# Patient Record
Sex: Female | Born: 1945
Health system: Southern US, Community
[De-identification: ages and names within clinical notes are randomized; demographics above are authoritative.]

## PROBLEM LIST (undated history)

## (undated) DIAGNOSIS — M1990 Unspecified osteoarthritis, unspecified site: Secondary | ICD-10-CM

## (undated) DIAGNOSIS — C50912 Malignant neoplasm of unspecified site of left female breast: Secondary | ICD-10-CM

## (undated) DIAGNOSIS — N183 Chronic kidney disease, stage 3 unspecified: Secondary | ICD-10-CM

## (undated) DIAGNOSIS — S68119A Complete traumatic metacarpophalangeal amputation of unspecified finger, initial encounter: Secondary | ICD-10-CM

## (undated) DIAGNOSIS — I719 Aortic aneurysm of unspecified site, without rupture: Secondary | ICD-10-CM

## (undated) DIAGNOSIS — G5793 Unspecified mononeuropathy of bilateral lower limbs: Secondary | ICD-10-CM

---

## 1998-12-27 ENCOUNTER — Emergency Department (HOSPITAL_COMMUNITY): Admission: EM | Admit: 1998-12-27 | Discharge: 1998-12-27 | Payer: Self-pay | Admitting: Emergency Medicine

## 1999-08-26 ENCOUNTER — Encounter: Payer: Self-pay | Admitting: Neurological Surgery

## 1999-08-26 ENCOUNTER — Ambulatory Visit (HOSPITAL_COMMUNITY): Admission: RE | Admit: 1999-08-26 | Discharge: 1999-08-26 | Payer: Self-pay | Admitting: Neurological Surgery

## 2001-04-17 ENCOUNTER — Ambulatory Visit (HOSPITAL_COMMUNITY): Admission: RE | Admit: 2001-04-17 | Discharge: 2001-04-17 | Payer: Self-pay | Admitting: Neurological Surgery

## 2001-04-17 ENCOUNTER — Encounter: Payer: Self-pay | Admitting: Neurological Surgery

## 2001-04-18 ENCOUNTER — Encounter: Payer: Self-pay | Admitting: Neurological Surgery

## 2001-04-18 ENCOUNTER — Encounter: Admission: RE | Admit: 2001-04-18 | Discharge: 2001-04-18 | Payer: Self-pay | Admitting: Neurological Surgery

## 2003-01-04 ENCOUNTER — Encounter: Payer: Self-pay | Admitting: Emergency Medicine

## 2003-01-04 ENCOUNTER — Emergency Department (HOSPITAL_COMMUNITY): Admission: EM | Admit: 2003-01-04 | Discharge: 2003-01-04 | Payer: Self-pay | Admitting: Emergency Medicine

## 2004-11-02 ENCOUNTER — Ambulatory Visit: Payer: Self-pay | Admitting: Internal Medicine

## 2007-12-12 ENCOUNTER — Emergency Department: Payer: Self-pay | Admitting: Emergency Medicine

## 2009-01-07 ENCOUNTER — Ambulatory Visit: Payer: Self-pay | Admitting: Gastroenterology

## 2009-01-12 ENCOUNTER — Ambulatory Visit: Payer: Self-pay | Admitting: Gastroenterology

## 2012-08-06 DIAGNOSIS — C50912 Malignant neoplasm of unspecified site of left female breast: Secondary | ICD-10-CM

## 2012-08-06 HISTORY — DX: Malignant neoplasm of unspecified site of left female breast: C50.912

## 2014-05-10 ENCOUNTER — Ambulatory Visit: Payer: Self-pay | Admitting: Gastroenterology

## 2014-05-12 LAB — PATHOLOGY REPORT

## 2016-09-10 DIAGNOSIS — K119 Disease of salivary gland, unspecified: Secondary | ICD-10-CM | POA: Diagnosis not present

## 2016-09-10 DIAGNOSIS — R05 Cough: Secondary | ICD-10-CM | POA: Diagnosis not present

## 2016-09-12 ENCOUNTER — Other Ambulatory Visit: Payer: Self-pay | Admitting: Otolaryngology

## 2016-09-12 DIAGNOSIS — D110 Benign neoplasm of parotid gland: Secondary | ICD-10-CM

## 2016-09-12 DIAGNOSIS — R221 Localized swelling, mass and lump, neck: Secondary | ICD-10-CM

## 2016-09-12 DIAGNOSIS — D37030 Neoplasm of uncertain behavior of the parotid salivary glands: Secondary | ICD-10-CM | POA: Diagnosis not present

## 2016-09-12 DIAGNOSIS — H6980 Other specified disorders of Eustachian tube, unspecified ear: Secondary | ICD-10-CM | POA: Diagnosis not present

## 2016-09-18 ENCOUNTER — Ambulatory Visit
Admission: RE | Admit: 2016-09-18 | Discharge: 2016-09-18 | Disposition: A | Discharge status: Home / Self Care | Payer: 59 | Source: Ambulatory Visit | Attending: Otolaryngology | Admitting: Otolaryngology

## 2016-09-18 DIAGNOSIS — K118 Other diseases of salivary glands: Secondary | ICD-10-CM | POA: Diagnosis not present

## 2016-09-18 DIAGNOSIS — I700 Atherosclerosis of aorta: Secondary | ICD-10-CM | POA: Diagnosis not present

## 2016-09-18 DIAGNOSIS — D110 Benign neoplasm of parotid gland: Secondary | ICD-10-CM | POA: Insufficient documentation

## 2016-09-18 DIAGNOSIS — R221 Localized swelling, mass and lump, neck: Secondary | ICD-10-CM

## 2016-09-18 HISTORY — DX: Malignant neoplasm of unspecified site of left female breast: C50.912

## 2016-09-18 LAB — POCT I-STAT CREATININE: Creatinine, Ser: 1 mg/dL (ref 0.44–1.00)

## 2016-09-18 IMAGING — CT CT NECK W/ CM
2 of 3 series · 7 of 14 positions shown, 8 images · IV contrast (iopamidol)
Comparison: Brain MRI [DATE]

CLINICAL DATA: Parotid mass

EXAM:
CT NECK WITH CONTRAST
TECHNIQUE: Multidetector CT imaging of the neck was performed using the
standard protocol following the bolus administration of intravenous
contrast.
CONTRAST:  75mL [7U] IOPAMIDOL ([7U]) INJECTION 61%

[Series 2: axial neck · axial · 0.52mm/px · z∈[-254,-126]mm · 3 of 129 slices shown]
[im 33/129  bone]
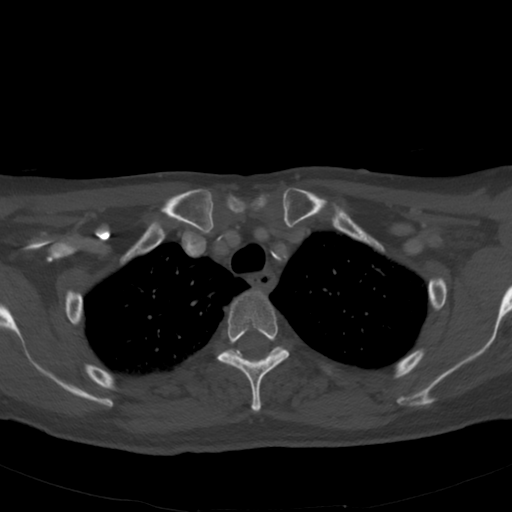
[im 65/129  bone]
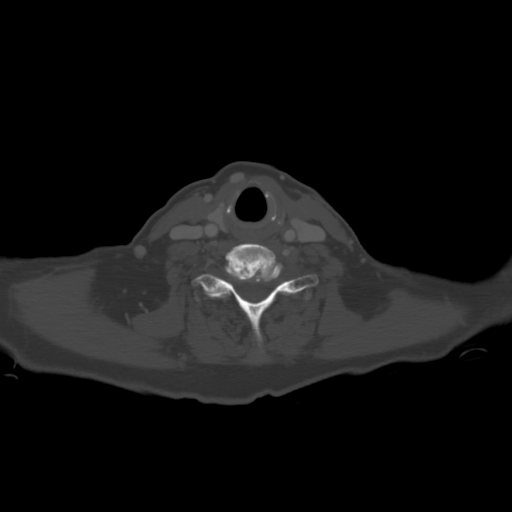
[im 97/129  bone]
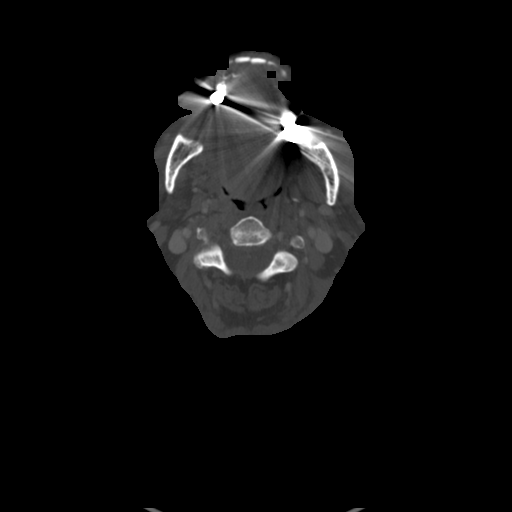

[Series 7: orthogonal ax · axial · 0.41mm/px · z∈[-291,-127]mm · 4 of 140 slices shown, 5 images]
[im 28/140  soft-tissue]
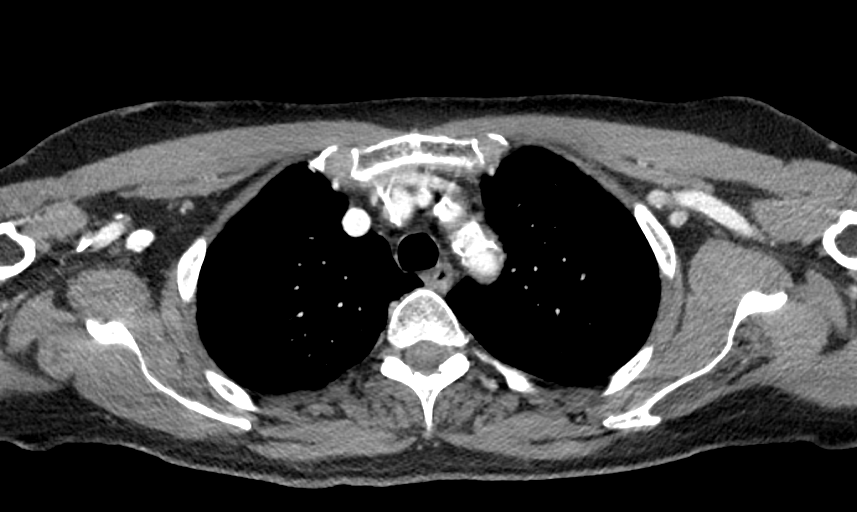
[im 28/140  bone]
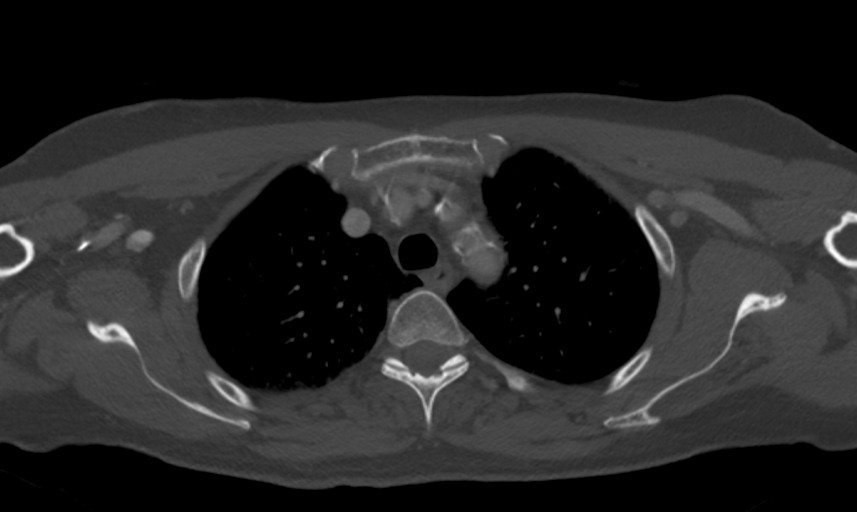
[im 56/140  bone]
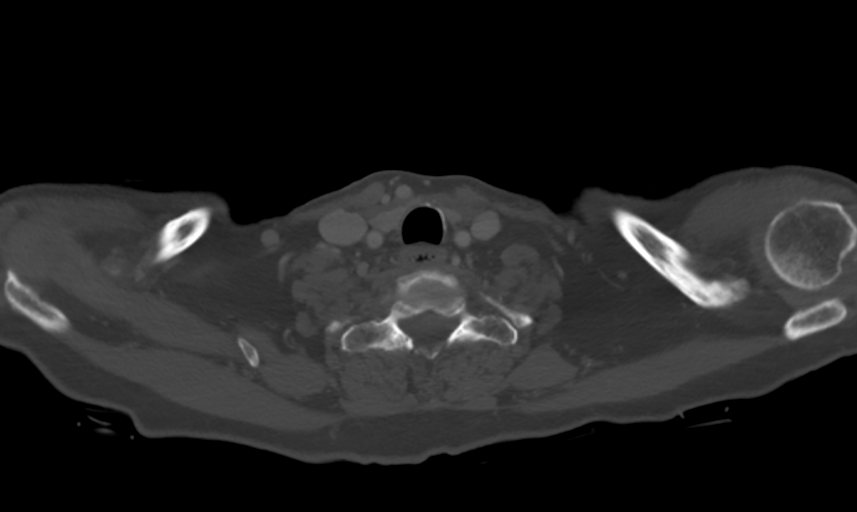
[im 84/140  bone]
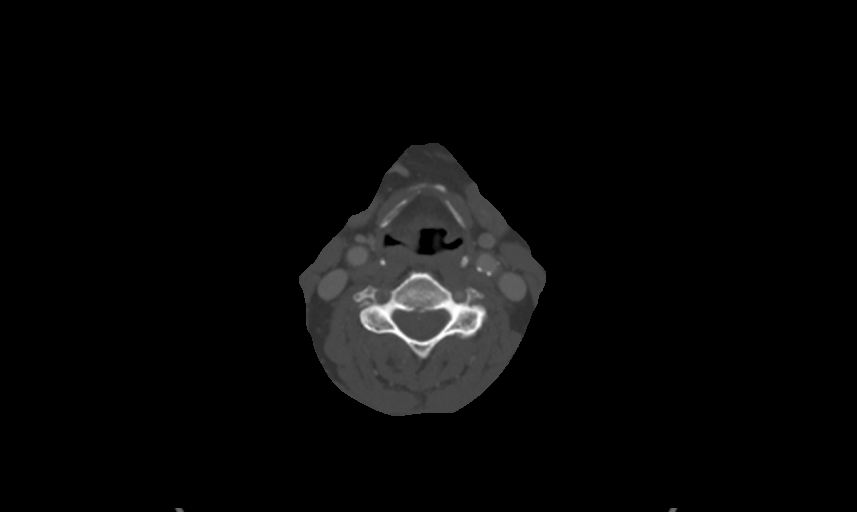
[im 112/140  bone]
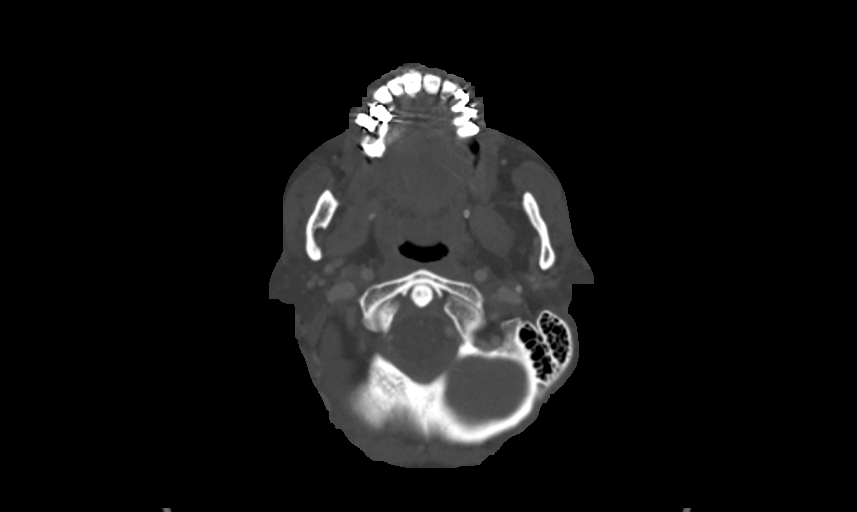

[7 of 14 positions shown; findings below may reference images not displayed]

FINDINGS: Pharynx and larynx: The nasopharynx is clear. The oropharynx and
hypopharynx are normal. The epiglottis, supraglottic larynx, glottis
and subglottic larynx are normal. No retropharyngeal collection. The
parapharyngeal spaces are preserved. The visible oral cavity and
base of tongue are normal.

Salivary glands: The parotid and submandibular glands are normal. No
sialolithiasis or salivary ductal dilatation.

Thyroid: Normal

Lymph nodes: No enlarged or abnormal density cervical lymph nodes.

Vascular: There is atherosclerotic calcification of the aortic arch.
There is non opacification of the right vertebral artery beyond the
C6 level. There is reconstitution of the V4 segment.

Limited intracranial: Normal

Visualized orbits: Normal

Mastoids and visualized paranasal sinuses: Clear

Skeleton: Cervical degenerative disc disease without bony spinal
canal stenosis.

Upper chest: Dependent bilateral subsegmental atelectasis. No
pulmonary nodules or masses. No visible pleural effusion.

Other: None
IMPRESSION: 1. No parotid or submandibular mass to account for the reported
symptoms.
2. No cervical lymphadenopathy.
3. Aortic atherosclerosis.
4. Occlusion of the right vertebral artery at the level of the C6-7
disc space with reconstitution of the V4 segment.

## 2016-09-18 MED ORDER — IOPAMIDOL (ISOVUE-300) INJECTION 61%
75.0000 mL | Freq: Once | INTRAVENOUS | Status: AC | PRN
  Administered 2016-09-18: 75 mL via INTRAVENOUS

## 2016-10-08 DIAGNOSIS — I890 Lymphedema, not elsewhere classified: Secondary | ICD-10-CM | POA: Diagnosis not present

## 2016-10-08 DIAGNOSIS — C50112 Malignant neoplasm of central portion of left female breast: Secondary | ICD-10-CM | POA: Diagnosis not present

## 2016-10-08 DIAGNOSIS — Z170 Estrogen receptor positive status [ER+]: Secondary | ICD-10-CM | POA: Diagnosis not present

## 2016-10-08 DIAGNOSIS — M2550 Pain in unspecified joint: Secondary | ICD-10-CM | POA: Diagnosis not present

## 2016-10-16 DIAGNOSIS — Z170 Estrogen receptor positive status [ER+]: Secondary | ICD-10-CM | POA: Diagnosis not present

## 2016-10-16 DIAGNOSIS — C50112 Malignant neoplasm of central portion of left female breast: Secondary | ICD-10-CM | POA: Diagnosis not present

## 2016-10-16 DIAGNOSIS — J432 Centrilobular emphysema: Secondary | ICD-10-CM | POA: Diagnosis not present

## 2016-10-16 DIAGNOSIS — R918 Other nonspecific abnormal finding of lung field: Secondary | ICD-10-CM | POA: Diagnosis not present

## 2016-10-16 DIAGNOSIS — I2510 Atherosclerotic heart disease of native coronary artery without angina pectoris: Secondary | ICD-10-CM | POA: Diagnosis not present

## 2016-10-31 DIAGNOSIS — M5030 Other cervical disc degeneration, unspecified cervical region: Secondary | ICD-10-CM | POA: Diagnosis not present

## 2016-10-31 DIAGNOSIS — M25542 Pain in joints of left hand: Secondary | ICD-10-CM | POA: Diagnosis not present

## 2016-10-31 DIAGNOSIS — M791 Myalgia: Secondary | ICD-10-CM | POA: Diagnosis not present

## 2016-10-31 DIAGNOSIS — M1812 Unilateral primary osteoarthritis of first carpometacarpal joint, left hand: Secondary | ICD-10-CM | POA: Diagnosis not present

## 2016-10-31 DIAGNOSIS — M79642 Pain in left hand: Secondary | ICD-10-CM | POA: Diagnosis not present

## 2016-11-21 DIAGNOSIS — Z853 Personal history of malignant neoplasm of breast: Secondary | ICD-10-CM | POA: Diagnosis not present

## 2016-11-21 DIAGNOSIS — L905 Scar conditions and fibrosis of skin: Secondary | ICD-10-CM | POA: Diagnosis not present

## 2016-11-21 DIAGNOSIS — I890 Lymphedema, not elsewhere classified: Secondary | ICD-10-CM | POA: Diagnosis not present

## 2017-01-10 DIAGNOSIS — Z853 Personal history of malignant neoplasm of breast: Secondary | ICD-10-CM | POA: Diagnosis not present

## 2017-01-10 DIAGNOSIS — I890 Lymphedema, not elsewhere classified: Secondary | ICD-10-CM | POA: Diagnosis not present

## 2017-01-10 DIAGNOSIS — L905 Scar conditions and fibrosis of skin: Secondary | ICD-10-CM | POA: Diagnosis not present

## 2017-02-05 DIAGNOSIS — D510 Vitamin B12 deficiency anemia due to intrinsic factor deficiency: Secondary | ICD-10-CM | POA: Diagnosis not present

## 2017-02-05 DIAGNOSIS — I739 Peripheral vascular disease, unspecified: Secondary | ICD-10-CM | POA: Diagnosis not present

## 2017-02-05 DIAGNOSIS — E784 Other hyperlipidemia: Secondary | ICD-10-CM | POA: Diagnosis not present

## 2017-02-12 DIAGNOSIS — I739 Peripheral vascular disease, unspecified: Secondary | ICD-10-CM | POA: Diagnosis not present

## 2017-02-12 DIAGNOSIS — Z0000 Encounter for general adult medical examination without abnormal findings: Secondary | ICD-10-CM | POA: Diagnosis not present

## 2017-02-12 DIAGNOSIS — D510 Vitamin B12 deficiency anemia due to intrinsic factor deficiency: Secondary | ICD-10-CM | POA: Diagnosis not present

## 2017-03-05 DIAGNOSIS — C50419 Malignant neoplasm of upper-outer quadrant of unspecified female breast: Secondary | ICD-10-CM | POA: Diagnosis not present

## 2017-03-05 DIAGNOSIS — M2550 Pain in unspecified joint: Secondary | ICD-10-CM | POA: Diagnosis not present

## 2017-03-05 DIAGNOSIS — C50112 Malignant neoplasm of central portion of left female breast: Secondary | ICD-10-CM | POA: Diagnosis not present

## 2017-03-05 DIAGNOSIS — Z170 Estrogen receptor positive status [ER+]: Secondary | ICD-10-CM | POA: Diagnosis not present

## 2017-05-29 DIAGNOSIS — I890 Lymphedema, not elsewhere classified: Secondary | ICD-10-CM | POA: Diagnosis not present

## 2017-05-29 DIAGNOSIS — Z853 Personal history of malignant neoplasm of breast: Secondary | ICD-10-CM | POA: Diagnosis not present

## 2017-05-29 DIAGNOSIS — L905 Scar conditions and fibrosis of skin: Secondary | ICD-10-CM | POA: Diagnosis not present

## 2017-06-04 DIAGNOSIS — N183 Chronic kidney disease, stage 3 (moderate): Secondary | ICD-10-CM | POA: Diagnosis not present

## 2017-06-04 DIAGNOSIS — D510 Vitamin B12 deficiency anemia due to intrinsic factor deficiency: Secondary | ICD-10-CM | POA: Diagnosis not present

## 2017-06-04 DIAGNOSIS — I700 Atherosclerosis of aorta: Secondary | ICD-10-CM | POA: Diagnosis not present

## 2017-06-05 DIAGNOSIS — M1812 Unilateral primary osteoarthritis of first carpometacarpal joint, left hand: Secondary | ICD-10-CM | POA: Diagnosis not present

## 2017-08-21 DIAGNOSIS — R0982 Postnasal drip: Secondary | ICD-10-CM | POA: Diagnosis not present

## 2017-08-21 DIAGNOSIS — J019 Acute sinusitis, unspecified: Secondary | ICD-10-CM | POA: Diagnosis not present

## 2018-05-06 DIAGNOSIS — C3490 Malignant neoplasm of unspecified part of unspecified bronchus or lung: Secondary | ICD-10-CM

## 2018-05-06 HISTORY — PX: LUNG LOBECTOMY: SHX167

## 2018-05-06 HISTORY — DX: Malignant neoplasm of unspecified part of unspecified bronchus or lung: C34.90

## 2018-06-24 ENCOUNTER — Other Ambulatory Visit: Payer: Self-pay | Admitting: Physician Assistant

## 2018-06-24 ENCOUNTER — Ambulatory Visit
Admission: RE | Admit: 2018-06-24 | Discharge: 2018-06-24 | Disposition: A | Discharge status: Home / Self Care | Payer: 59 | Source: Ambulatory Visit | Attending: Physician Assistant | Admitting: Physician Assistant

## 2018-06-24 DIAGNOSIS — R0602 Shortness of breath: Secondary | ICD-10-CM

## 2018-06-24 DIAGNOSIS — Z85118 Personal history of other malignant neoplasm of bronchus and lung: Secondary | ICD-10-CM | POA: Insufficient documentation

## 2018-06-24 DIAGNOSIS — I2510 Atherosclerotic heart disease of native coronary artery without angina pectoris: Secondary | ICD-10-CM | POA: Insufficient documentation

## 2018-06-24 DIAGNOSIS — I700 Atherosclerosis of aorta: Secondary | ICD-10-CM | POA: Diagnosis not present

## 2018-06-24 LAB — POCT I-STAT CREATININE: Creatinine, Ser: 0.9 mg/dL (ref 0.44–1.00)

## 2018-06-24 MED ORDER — IOHEXOL 300 MG/ML  SOLN
75.0000 mL | Freq: Once | INTRAMUSCULAR | Status: AC | PRN
  Administered 2018-06-24: 75 mL via INTRAVENOUS

## 2019-01-14 ENCOUNTER — Encounter: Payer: Self-pay | Admitting: *Deleted

## 2019-01-14 ENCOUNTER — Other Ambulatory Visit: Payer: Self-pay

## 2019-01-15 NOTE — Discharge Instructions (Signed)

## 2019-01-16 ENCOUNTER — Other Ambulatory Visit: Payer: Self-pay

## 2019-01-16 ENCOUNTER — Other Ambulatory Visit
Admission: RE | Admit: 2019-01-16 | Discharge: 2019-01-16 | Disposition: A | Discharge status: Home / Self Care | Payer: 59 | Source: Ambulatory Visit | Attending: Ophthalmology | Admitting: Ophthalmology

## 2019-01-16 DIAGNOSIS — Z1159 Encounter for screening for other viral diseases: Secondary | ICD-10-CM | POA: Insufficient documentation

## 2019-01-16 DIAGNOSIS — Z01812 Encounter for preprocedural laboratory examination: Secondary | ICD-10-CM | POA: Insufficient documentation

## 2019-01-17 LAB — NOVEL CORONAVIRUS, NAA (HOSP ORDER, SEND-OUT TO REF LAB; TAT 18-24 HRS): SARS-CoV-2, NAA: NOT DETECTED

## 2019-01-21 ENCOUNTER — Ambulatory Visit
Admission: RE | Admit: 2019-01-21 | Discharge: 2019-01-21 | Disposition: A | Discharge status: Home / Self Care | Payer: 59 | Attending: Ophthalmology | Admitting: Ophthalmology

## 2019-01-21 ENCOUNTER — Encounter
Admission: RE | Disposition: A | Discharge status: Home / Self Care | Payer: Self-pay | Source: Home / Self Care | Attending: Ophthalmology

## 2019-01-21 ENCOUNTER — Other Ambulatory Visit: Payer: Self-pay

## 2019-01-21 ENCOUNTER — Ambulatory Visit: Payer: 59 | Admitting: Anesthesiology

## 2019-01-21 DIAGNOSIS — Z853 Personal history of malignant neoplasm of breast: Secondary | ICD-10-CM | POA: Insufficient documentation

## 2019-01-21 DIAGNOSIS — H2512 Age-related nuclear cataract, left eye: Secondary | ICD-10-CM | POA: Diagnosis not present

## 2019-01-21 DIAGNOSIS — M1990 Unspecified osteoarthritis, unspecified site: Secondary | ICD-10-CM | POA: Diagnosis not present

## 2019-01-21 DIAGNOSIS — Z79899 Other long term (current) drug therapy: Secondary | ICD-10-CM | POA: Insufficient documentation

## 2019-01-21 DIAGNOSIS — Z85118 Personal history of other malignant neoplasm of bronchus and lung: Secondary | ICD-10-CM | POA: Insufficient documentation

## 2019-01-21 DIAGNOSIS — Z9221 Personal history of antineoplastic chemotherapy: Secondary | ICD-10-CM | POA: Diagnosis not present

## 2019-01-21 DIAGNOSIS — G629 Polyneuropathy, unspecified: Secondary | ICD-10-CM | POA: Diagnosis not present

## 2019-01-21 DIAGNOSIS — Z87891 Personal history of nicotine dependence: Secondary | ICD-10-CM | POA: Diagnosis not present

## 2019-01-21 DIAGNOSIS — Z981 Arthrodesis status: Secondary | ICD-10-CM | POA: Diagnosis not present

## 2019-01-21 DIAGNOSIS — Z902 Acquired absence of lung [part of]: Secondary | ICD-10-CM | POA: Diagnosis not present

## 2019-01-21 HISTORY — DX: Unspecified osteoarthritis, unspecified site: M19.90

## 2019-01-21 HISTORY — DX: Unspecified mononeuropathy of bilateral lower limbs: G57.93

## 2019-01-21 HISTORY — PX: CATARACT EXTRACTION W/PHACO: SHX586

## 2019-01-21 HISTORY — DX: Aortic aneurysm of unspecified site, without rupture: I71.9

## 2019-01-21 HISTORY — DX: Complete traumatic metacarpophalangeal amputation of unspecified finger, initial encounter: S68.119A

## 2019-01-21 SURGERY — PHACOEMULSIFICATION, CATARACT, WITH IOL INSERTION
Anesthesia: Monitor Anesthesia Care | Site: Eye | Laterality: Left

## 2019-01-21 MED ORDER — TETRACAINE HCL 0.5 % OP SOLN
1.0000 [drp] | OPHTHALMIC | Status: DC | PRN
  Administered 2019-01-21 (×3): 1 [drp] via OPHTHALMIC

## 2019-01-21 MED ORDER — ARMC OPHTHALMIC DILATING DROPS
1.0000 "application " | OPHTHALMIC | Status: DC | PRN
  Administered 2019-01-21 (×3): 1 via OPHTHALMIC

## 2019-01-21 MED ORDER — FENTANYL CITRATE (PF) 100 MCG/2ML IJ SOLN
INTRAMUSCULAR | Status: DC | PRN
  Administered 2019-01-21: 50 ug via INTRAVENOUS

## 2019-01-21 MED ORDER — BRIMONIDINE TARTRATE-TIMOLOL 0.2-0.5 % OP SOLN
OPHTHALMIC | Status: DC | PRN
  Administered 2019-01-21: 1 [drp] via OPHTHALMIC

## 2019-01-21 MED ORDER — MIDAZOLAM HCL 2 MG/2ML IJ SOLN
INTRAMUSCULAR | Status: DC | PRN
  Administered 2019-01-21: 1 mg via INTRAVENOUS

## 2019-01-21 MED ORDER — LIDOCAINE HCL (PF) 2 % IJ SOLN
INTRAOCULAR | Status: DC | PRN
  Administered 2019-01-21: 1 mL

## 2019-01-21 MED ORDER — EPINEPHRINE PF 1 MG/ML IJ SOLN
INTRAOCULAR | Status: DC | PRN
  Administered 2019-01-21: 65 mL via OPHTHALMIC

## 2019-01-21 MED ORDER — MOXIFLOXACIN HCL 0.5 % OP SOLN
1.0000 [drp] | OPHTHALMIC | Status: DC | PRN
  Administered 2019-01-21 (×3): 1 [drp] via OPHTHALMIC

## 2019-01-21 MED ORDER — NA HYALUR & NA CHOND-NA HYALUR 0.4-0.35 ML IO KIT
PACK | INTRAOCULAR | Status: DC | PRN
  Administered 2019-01-21: 1 mL via INTRAOCULAR

## 2019-01-21 MED ORDER — CEFUROXIME OPHTHALMIC INJECTION 1 MG/0.1 ML
INJECTION | OPHTHALMIC | Status: DC | PRN
  Administered 2019-01-21: 0.1 mL via INTRACAMERAL

## 2019-01-21 SURGICAL SUPPLY — 20 items
ACRYSOF IQ PANOPTIX TORIC IOL (Intraocular Lens) ×2 IMPLANT
CANNULA ANT/CHMB 27G (MISCELLANEOUS) ×1 IMPLANT
CANNULA ANT/CHMB 27GA (MISCELLANEOUS) ×3 IMPLANT
GLOVE SURG LX 7.5 STRW (GLOVE) ×2
GLOVE SURG LX STRL 7.5 STRW (GLOVE) ×1 IMPLANT
GLOVE SURG TRIUMPH 8.0 PF LTX (GLOVE) ×3 IMPLANT
GOWN STRL REUS W/ TWL LRG LVL3 (GOWN DISPOSABLE) ×2 IMPLANT
GOWN STRL REUS W/TWL LRG LVL3 (GOWN DISPOSABLE) ×4
MARKER SKIN DUAL TIP RULER LAB (MISCELLANEOUS) ×3 IMPLANT
NDL FILTER BLUNT 18X1 1/2 (NEEDLE) ×1 IMPLANT
NEEDLE FILTER BLUNT 18X 1/2SAF (NEEDLE) ×2
NEEDLE FILTER BLUNT 18X1 1/2 (NEEDLE) ×1 IMPLANT
PACK CATARACT BRASINGTON (MISCELLANEOUS) ×3 IMPLANT
PACK EYE AFTER SURG (MISCELLANEOUS) ×3 IMPLANT
PACK OPTHALMIC (MISCELLANEOUS) ×3 IMPLANT
SYR 3ML LL SCALE MARK (SYRINGE) ×3 IMPLANT
SYR 5ML LL (SYRINGE) ×3 IMPLANT
SYR TB 1ML LUER SLIP (SYRINGE) ×3 IMPLANT
WATER STERILE IRR 500ML POUR (IV SOLUTION) ×3 IMPLANT
WIPE NON LINTING 3.25X3.25 (MISCELLANEOUS) ×3 IMPLANT

## 2019-01-21 NOTE — Transfer of Care (Signed)
Immediate Anesthesia Transfer of Care Note  Patient: Stacy Hogan  Procedure(s) Performed: CATARACT EXTRACTION PHACO AND INTRAOCULAR LENS PLACEMENT ([place]) V LEFT (Left Eye)  Patient Location: PACU  Anesthesia Type: MAC  Level of Consciousness: awake, alert  and patient cooperative  Airway and Oxygen Therapy: Patient Spontanous Breathing and Patient connected to supplemental oxygen  Post-op Assessment: Post-op Vital signs reviewed, Patient's Cardiovascular Status Stable, Respiratory Function Stable, Patent Airway and No signs of Nausea or vomiting  Post-op Vital Signs: Reviewed and stable  Complications: No apparent anesthesia complications

## 2019-01-21 NOTE — H&P (Signed)

## 2019-01-21 NOTE — Op Note (Signed)
LOCATION:  [place]   PREOPERATIVE DIAGNOSIS:  Nuclear sclerotic cataract of the left eye.  H25.12  POSTOPERATIVE DIAGNOSIS:  Nuclear sclerotic cataract of the left eye.   PROCEDURE:  Phacoemulsification with Toric posterior chamber intraocular lens placement of the left eye.   LENS:  Implant Name Type Inv. Item Serial No. Manufacturer Lot No. LRB No. Used Action  ACRYSOF IQ PANOPTIX TORIC IOL Intraocular Lens  [id number] ALCON  Left 1 Implanted  [id number] 17.5 D Toric intraocular lens with 1.5 diopters of cylindrical power with axis orientation at 2 degrees.   ULTRASOUND TIME: 12 % of 1 minutes, 5 seconds.  CDE 8.0   SURGEON:  Wyonia Hough, MD   ANESTHESIA:  Topical with tetracaine drops and 2% Xylocaine jelly, augmented with 1% preservative-free intracameral lidocaine.  COMPLICATIONS:  None.   DESCRIPTION OF PROCEDURE:  The patient was identified in the holding room and transported to the operating suite and placed in the supine position under the operating microscope.  The left eye was identified as the operative eye, and it was prepped and draped in the usual sterile ophthalmic fashion.    A clear-corneal paracentesis incision was made at the 1:30 position.  0.5 ml of preservative-free 1% lidocaine was injected into the anterior chamber. The anterior chamber was filled with Viscoat.  A 2.4 millimeter near clear corneal incision was then made at the 10:30 position.  A cystotome and capsulorrhexis forceps were then used to make a curvilinear capsulorrhexis.  Hydrodissection and hydrodelineation were then performed using balanced salt solution.   Phacoemulsification was then used in stop and chop fashion to remove the lens, nucleus and epinucleus.  The remaining cortex was aspirated using the irrigation and aspiration handpiece.  Provisc viscoelastic was then placed into the capsular bag to distend it for lens placement.  The Verion digital marker was used to align the  implant at the intended axis.   A 17.5 diopter lens was then injected into the capsular bag.  It was rotated clockwise until the axis marks on the lens were approximately 15 degrees in the counterclockwise direction to the intended alignment.  The viscoelastic was aspirated from the eye using the irrigation aspiration handpiece.  Then, a Koch spatula through the sideport incision was used to rotate the lens in a clockwise direction until the axis markings of the intraocular lens were lined up with the Verion alignment.  Balanced salt solution was then used to hydrate the wounds. Cefuroxime 0.1 ml of a 10mg /ml solution was injected into the anterior chamber for a dose of 1 mg of intracameral antibiotic at the completion of the case.    The eye was noted to have a physiologic pressure and there was no wound leak noted.   Timolol and Brimonidine drops were applied to the eye.  The patient was taken to the recovery room in stable condition having had no complications of anesthesia or surgery.  [place] 01/21/2019, 1:41 PM

## 2019-01-21 NOTE — Anesthesia Postprocedure Evaluation (Signed)
Anesthesia Post Note  Patient: Stacy Hogan  Procedure(s) Performed: CATARACT EXTRACTION PHACO AND INTRAOCULAR LENS PLACEMENT ([place]) V LEFT (Left Eye)  Patient location during evaluation: PACU Anesthesia Type: MAC Level of consciousness: awake and alert Pain management: pain level controlled Vital Signs Assessment: post-procedure vital signs reviewed and stable Respiratory status: spontaneous breathing, nonlabored ventilation, respiratory function stable and patient connected to nasal cannula oxygen Cardiovascular status: stable and blood pressure returned to baseline Postop Assessment: no apparent nausea or vomiting Anesthetic complications: no    Renata Gambino

## 2019-01-21 NOTE — Anesthesia Procedure Notes (Signed)
Procedure Name: [place] Performed by: Cameron Ali, CRNA Pre-anesthesia Checklist: Patient identified, Emergency Drugs available, Suction available, Timeout performed and Patient being monitored Patient Re-evaluated:Patient Re-evaluated prior to induction Oxygen Delivery Method: Nasal cannula Placement Confirmation: positive ETCO2

## 2019-01-21 NOTE — Anesthesia Preprocedure Evaluation (Addendum)
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   Anesthesia Other Findings Covid; NEG  Reproductive/Obstetrics                            Anesthesia Physical Anesthesia Plan  ASA: III  Anesthesia Plan: MAC   Post-op Pain Management:    Induction:   PONV Risk Score and Plan:   Airway Management Planned:   Additional Equipment:   Intra-op Plan:   Post-operative Plan:   Informed Consent: I have reviewed the patients History and Physical, chart, labs and discussed the procedure including the risks, benefits and alternatives for the proposed anesthesia with the patient or authorized representative who has indicated his/her understanding and acceptance.       Plan Discussed with: CRNA  Anesthesia Plan Comments:         Anesthesia Quick Evaluation

## 2019-01-22 ENCOUNTER — Encounter: Payer: Self-pay | Admitting: Ophthalmology

## 2019-02-04 ENCOUNTER — Other Ambulatory Visit: Payer: Self-pay

## 2019-02-04 ENCOUNTER — Encounter: Payer: Self-pay | Admitting: *Deleted

## 2019-02-06 ENCOUNTER — Other Ambulatory Visit
Admission: RE | Admit: 2019-02-06 | Discharge: 2019-02-06 | Disposition: A | Discharge status: Home / Self Care | Payer: 59 | Source: Ambulatory Visit | Attending: Ophthalmology | Admitting: Ophthalmology

## 2019-02-06 DIAGNOSIS — Z1159 Encounter for screening for other viral diseases: Secondary | ICD-10-CM | POA: Diagnosis not present

## 2019-02-06 DIAGNOSIS — Z01812 Encounter for preprocedural laboratory examination: Secondary | ICD-10-CM | POA: Diagnosis present

## 2019-02-06 LAB — SARS CORONAVIRUS 2 (TAT 6-24 HRS): SARS Coronavirus 2: NEGATIVE

## 2019-02-07 NOTE — Anesthesia Preprocedure Evaluation (Addendum)
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   Anesthesia Other Findings Covid; NEG  Reproductive/Obstetrics                            Anesthesia Physical  Anesthesia Plan  ASA: III  Anesthesia Plan: MAC   Post-op Pain Management:    Induction:   PONV Risk Score and Plan: 2 and TIVA and Midazolam  Airway Management Planned: Natural Airway  Additional Equipment:   Intra-op Plan:   Post-operative Plan:   Informed Consent: I have reviewed the patients History and Physical, chart, labs and discussed the procedure including the risks, benefits and alternatives for the proposed anesthesia with the patient or authorized representative who has indicated his/her understanding and acceptance.       Plan  Discussed with: CRNA  Anesthesia Plan Comments:        Anesthesia Quick Evaluation

## 2019-02-09 NOTE — Discharge Instructions (Signed)
General Anesthesia, Adult, Care After °This sheet gives you information about how to care for yourself after your procedure. Your health care provider may also give you more specific instructions. If you have problems or questions, contact your health care provider. °What can I expect after the procedure? °After the procedure, the following side effects are common: °· Pain or discomfort at the IV site. °· Nausea. °· Vomiting. °· Sore throat. °· Trouble concentrating. °· Feeling cold or chills. °· Weak or tired. °· Sleepiness and fatigue. °· Soreness and body aches. These side effects can affect parts of the body that were not involved in surgery. °Follow these instructions at home: ° °For at least 24 hours after the procedure: °· Have a responsible adult stay with you. It is important to have someone help care for you until you are awake and alert. °· Rest as needed. °· Do not: °? Participate in activities in which you could fall or become injured. °? Drive. °? Use heavy machinery. °? Drink alcohol. °? Take sleeping pills or medicines that cause drowsiness. °? Make important decisions or sign legal documents. °? Take care of children on your own. °Eating and drinking °· Follow any instructions from your health care provider about eating or drinking restrictions. °· When you feel hungry, start by eating small amounts of foods that are soft and easy to digest (bland), such as toast. Gradually return to your regular diet. °· Drink enough fluid to keep your urine pale yellow. °· If you vomit, rehydrate by drinking water, juice, or clear broth. °General instructions °· If you have sleep apnea, surgery and certain medicines can increase your risk for breathing problems. Follow instructions from your health care provider about wearing your sleep device: °? Anytime you are sleeping, including during daytime naps. °? While taking prescription pain medicines, sleeping medicines, or medicines that make you drowsy. °· Return to  your normal activities as told by your health care provider. Ask your health care provider what activities are safe for you. °· Take over-the-counter and prescription medicines only as told by your health care provider. °· If you smoke, do not smoke without supervision. °· Keep all follow-up visits as told by your health care provider. This is important. °Contact a health care provider if: °· You have nausea or vomiting that does not get better with medicine. °· You cannot eat or drink without vomiting. °· You have pain that does not get better with medicine. °· You are unable to pass urine. °· You develop a skin rash. °· You have a fever. °· You have redness around your IV site that gets worse. °Get help right away if: °· You have difficulty breathing. °· You have chest pain. °· You have blood in your urine or stool, or you vomit blood. °Summary °· After the procedure, it is common to have a sore throat or nausea. It is also common to feel tired. °· Have a responsible adult stay with you for the first 24 hours after general anesthesia. It is important to have someone help care for you until you are awake and alert. °· When you feel hungry, start by eating small amounts of foods that are soft and easy to digest (bland), such as toast. Gradually return to your regular diet. °· Drink enough fluid to keep your urine pale yellow. °· Return to your normal activities as told by your health care provider. Ask your health care provider what activities are safe for you. °This information is not   intended to replace advice given to you by your health care provider. Make sure you discuss any questions you have with your health care provider. °Document Released: 10/29/2000 Document Revised: 07/26/2017 Document Reviewed: 03/08/2017 °Elsevier Patient Education © 2020 [employer] °Cataract Surgery, Care After °This sheet gives you information about how to care for yourself after your procedure. Your health care provider may also  give you more specific instructions. If you have problems or questions, contact your health care provider. °What can I expect after the procedure? °After the procedure, it is common to have: °· Itching. °· Discomfort. °· Fluid discharge. °· Sensitivity to light and to touch. °· Bruising in or around the eye. °· Mild blurred vision. °Follow these instructions at home: °Eye care ° °· Do not touch or rub your eyes. °· Protect your eyes as told by your health care provider. You may be told to wear a protective eye shield or sunglasses. °· Do not put a contact lens into the affected eye or eyes until your health care provider approves. °· Keep the area around your eye clean and dry: °? Avoid swimming. °? Do not allow water to hit you directly in the face while showering. °? Keep soap and shampoo out of your eyes. °· Check your eye every day for signs of infection. Watch for: °? Redness, swelling, or pain. °? Fluid, blood, or pus. °? Warmth. °? A bad smell. °? Vision that is getting worse. °? Sensitivity that is getting worse. °Activity °· Do not drive for 24 hours if you were given a sedative during your procedure. °· Avoid strenuous activities, such as playing contact sports, for as long as told by your health care provider. °· Do not drive or use heavy machinery until your health care provider approves. °· Do not bend or lift heavy objects. Bending increases pressure in the eye. You can walk, climb stairs, and do light household chores. °· Ask your health care provider when you can return to work. If you work in a dusty environment, you may be advised to wear protective eyewear for a period of time. °General instructions °· Take or apply over-the-counter and prescription medicines only as told by your health care provider. This includes eye drops. °· Keep all follow-up visits as told by your health care provider. This is important. °Contact a health care provider if: °· You have increased bruising around your  eye. °· You have pain that is not helped with medicine. °· You have a fever. °· You have redness, swelling, or pain in your eye. °· You have fluid, blood, or pus coming from your incision. °· Your vision gets worse. °· Your sensitivity to light gets worse. °Get help right away if: °· You have sudden loss of vision. °· You see flashes of light or spots (floaters). °· You have severe eye pain. °· You develop nausea or vomiting. °Summary °· After your procedure, it is common to have itching, discomfort, bruising, fluid discharge, or sensitivity to light. °· Follow instructions from your health care provider about caring for your eye after the procedure. °· Do not rub your eye after the procedure. You may need to wear eye protection or sunglasses. Do not wear contact lenses. Keep the area around your eye clean and dry. °· Avoid activities that require a lot of effort. These include playing sports and lifting heavy objects. °· Contact a health care provider if you have increased bruising, pain that does not go away, or a fever. Get   help right away if you suddenly lose your vision, see flashes of light or spots, or have severe pain in the eye. °This information is not intended to replace advice given to you by your health care provider. Make sure you discuss any questions you have with your health care provider. °Document Released: 02/09/2005 Document Revised: 01/20/2018 Document Reviewed: 01/20/2018 °Elsevier Patient Education © 2020 [employer] ° °

## 2019-02-11 ENCOUNTER — Other Ambulatory Visit: Payer: Self-pay

## 2019-02-11 ENCOUNTER — Encounter
Admission: RE | Disposition: A | Discharge status: Home / Self Care | Payer: Self-pay | Source: Home / Self Care | Attending: Ophthalmology

## 2019-02-11 ENCOUNTER — Ambulatory Visit
Admission: RE | Admit: 2019-02-11 | Discharge: 2019-02-11 | Disposition: A | Discharge status: Home / Self Care | Payer: 59 | Attending: Ophthalmology | Admitting: Ophthalmology

## 2019-02-11 ENCOUNTER — Ambulatory Visit: Payer: 59 | Admitting: Anesthesiology

## 2019-02-11 DIAGNOSIS — Z90710 Acquired absence of both cervix and uterus: Secondary | ICD-10-CM | POA: Insufficient documentation

## 2019-02-11 DIAGNOSIS — Z981 Arthrodesis status: Secondary | ICD-10-CM | POA: Diagnosis not present

## 2019-02-11 DIAGNOSIS — Z9221 Personal history of antineoplastic chemotherapy: Secondary | ICD-10-CM | POA: Insufficient documentation

## 2019-02-11 DIAGNOSIS — Z853 Personal history of malignant neoplasm of breast: Secondary | ICD-10-CM | POA: Insufficient documentation

## 2019-02-11 DIAGNOSIS — G709 Myoneural disorder, unspecified: Secondary | ICD-10-CM | POA: Insufficient documentation

## 2019-02-11 DIAGNOSIS — H2511 Age-related nuclear cataract, right eye: Secondary | ICD-10-CM | POA: Insufficient documentation

## 2019-02-11 DIAGNOSIS — Z8679 Personal history of other diseases of the circulatory system: Secondary | ICD-10-CM | POA: Diagnosis not present

## 2019-02-11 DIAGNOSIS — Z79899 Other long term (current) drug therapy: Secondary | ICD-10-CM | POA: Diagnosis not present

## 2019-02-11 DIAGNOSIS — Z85118 Personal history of other malignant neoplasm of bronchus and lung: Secondary | ICD-10-CM | POA: Diagnosis not present

## 2019-02-11 HISTORY — PX: CATARACT EXTRACTION W/PHACO: SHX586

## 2019-02-11 SURGERY — PHACOEMULSIFICATION, CATARACT, WITH IOL INSERTION
Anesthesia: Monitor Anesthesia Care | Site: Eye | Laterality: Right

## 2019-02-11 MED ORDER — FENTANYL CITRATE (PF) 100 MCG/2ML IJ SOLN
INTRAMUSCULAR | Status: DC | PRN
  Administered 2019-02-11: 50 ug via INTRAVENOUS

## 2019-02-11 MED ORDER — MIDAZOLAM HCL 2 MG/2ML IJ SOLN
INTRAMUSCULAR | Status: DC | PRN
  Administered 2019-02-11 (×2): 1 mg via INTRAVENOUS

## 2019-02-11 MED ORDER — TETRACAINE HCL 0.5 % OP SOLN
1.0000 [drp] | OPHTHALMIC | Status: DC | PRN
  Administered 2019-02-11 (×3): 1 [drp] via OPHTHALMIC

## 2019-02-11 MED ORDER — BRIMONIDINE TARTRATE-TIMOLOL 0.2-0.5 % OP SOLN
OPHTHALMIC | Status: DC | PRN
  Administered 2019-02-11: 1 [drp] via OPHTHALMIC

## 2019-02-11 MED ORDER — NA HYALUR & NA CHOND-NA HYALUR 0.4-0.35 ML IO KIT
PACK | INTRAOCULAR | Status: DC | PRN
  Administered 2019-02-11: 1 mL via INTRAOCULAR

## 2019-02-11 MED ORDER — MOXIFLOXACIN HCL 0.5 % OP SOLN
1.0000 [drp] | OPHTHALMIC | Status: DC | PRN
  Administered 2019-02-11 (×3): 1 [drp] via OPHTHALMIC

## 2019-02-11 MED ORDER — ARMC OPHTHALMIC DILATING DROPS
1.0000 "application " | OPHTHALMIC | Status: DC | PRN
  Administered 2019-02-11 (×3): 1 via OPHTHALMIC

## 2019-02-11 MED ORDER — LIDOCAINE HCL (PF) 2 % IJ SOLN
INTRAOCULAR | Status: DC | PRN
  Administered 2019-02-11: 1 mL

## 2019-02-11 MED ORDER — OXYCODONE HCL 5 MG PO TABS: 5.0000 mg | ORAL_TABLET | Freq: Once | ORAL | Status: DC | PRN

## 2019-02-11 MED ORDER — OXYCODONE HCL 5 MG/5ML PO SOLN: 5.0000 mg | Freq: Once | ORAL | Status: DC | PRN

## 2019-02-11 MED ORDER — EPINEPHRINE PF 1 MG/ML IJ SOLN
INTRAOCULAR | Status: DC | PRN
  Administered 2019-02-11: 12:00:00 78 mL via OPHTHALMIC

## 2019-02-11 MED ORDER — CEFUROXIME OPHTHALMIC INJECTION 1 MG/0.1 ML
INJECTION | OPHTHALMIC | Status: DC | PRN
  Administered 2019-02-11: 0.1 mL via INTRACAMERAL

## 2019-02-11 SURGICAL SUPPLY — 20 items
ACRYSOF IQ PANOPTIX IOL (Intraocular Lens) ×1 IMPLANT
CANNULA ANT/CHMB 27G (MISCELLANEOUS) ×1 IMPLANT
CANNULA ANT/CHMB 27GA (MISCELLANEOUS) ×2 IMPLANT
GLOVE SURG LX 7.5 STRW (GLOVE) ×1
GLOVE SURG LX STRL 7.5 STRW (GLOVE) ×1 IMPLANT
GLOVE SURG TRIUMPH 8.0 PF LTX (GLOVE) ×2 IMPLANT
GOWN STRL REUS W/ TWL LRG LVL3 (GOWN DISPOSABLE) ×2 IMPLANT
GOWN STRL REUS W/TWL LRG LVL3 (GOWN DISPOSABLE) ×2
MARKER SKIN DUAL TIP RULER LAB (MISCELLANEOUS) ×2 IMPLANT
NDL FILTER BLUNT 18X1 1/2 (NEEDLE) ×1 IMPLANT
NEEDLE FILTER BLUNT 18X 1/2SAF (NEEDLE) ×1
NEEDLE FILTER BLUNT 18X1 1/2 (NEEDLE) ×1 IMPLANT
PACK CATARACT BRASINGTON (MISCELLANEOUS) ×2 IMPLANT
PACK EYE AFTER SURG (MISCELLANEOUS) ×2 IMPLANT
PACK OPTHALMIC (MISCELLANEOUS) ×2 IMPLANT
SYR 3ML LL SCALE MARK (SYRINGE) ×2 IMPLANT
SYR 5ML LL (SYRINGE) ×2 IMPLANT
SYR TB 1ML LUER SLIP (SYRINGE) ×2 IMPLANT
WATER STERILE IRR 500ML POUR (IV SOLUTION) ×2 IMPLANT
WIPE NON LINTING 3.25X3.25 (MISCELLANEOUS) ×2 IMPLANT

## 2019-02-11 NOTE — Transfer of Care (Signed)
Immediate Anesthesia Transfer of Care Note  Patient: Stacy Hogan  Procedure(s) Performed: CATARACT EXTRACTION PHACO AND INTRAOCULAR LENS PLACEMENT ([facility])  RIGHT PANOPTIX LENS (Right Eye)  Patient Location: PACU  Anesthesia Type: MAC  Level of Consciousness: awake, alert  and patient cooperative  Airway and Oxygen Therapy: Patient Spontanous Breathing and Patient connected to supplemental oxygen  Post-op Assessment: Post-op Vital signs reviewed, Patient's Cardiovascular Status Stable, Respiratory Function Stable, Patent Airway and No signs of Nausea or vomiting  Post-op Vital Signs: Reviewed and stable  Complications: No apparent anesthesia complications

## 2019-02-11 NOTE — H&P (Signed)

## 2019-02-11 NOTE — Anesthesia Procedure Notes (Signed)
Procedure Name: MAC Performed by: Georga Bora, CRNA Pre-anesthesia Checklist: Patient identified, Emergency Drugs available, Suction available, Patient being monitored and Timeout performed Oxygen Delivery Method: Nasal cannula

## 2019-02-11 NOTE — Op Note (Signed)
LOCATION:  [place]   PREOPERATIVE DIAGNOSIS:    Nuclear sclerotic cataract right eye. H25.11   POSTOPERATIVE DIAGNOSIS:  Nuclear sclerotic cataract right eye.     PROCEDURE:  Phacoemusification with posterior chamber intraocular lens placement of the right eye   LENS:   Implant Name Type Inv. Item Serial No. Manufacturer Lot No. LRB No. Used Action  ACRYSOF IQ PANOPTIX IOL Intraocular Lens  [id number]   Right 1 Implanted     [id number] 18.0 D    ULTRASOUND TIME: 16 % of 1 minutes, 23 seconds.  CDE 12.9   SURGEON:  Wyonia Hough, MD   ANESTHESIA:  Topical with tetracaine drops and 2% Xylocaine jelly, augmented with 1% preservative-free intracameral lidocaine.    COMPLICATIONS:  None.   DESCRIPTION OF PROCEDURE:  The patient was identified in the holding room and transported to the operating room and placed in the supine position under the operating microscope.  The right eye was identified as the operative eye and it was prepped and draped in the usual sterile ophthalmic fashion.   A 1 millimeter clear-corneal paracentesis was made at the 12:00 position.  0.5 ml of preservative-free 1% lidocaine was injected into the anterior chamber. The anterior chamber was filled with Viscoat viscoelastic.  A 2.4 millimeter keratome was used to make a near-clear corneal incision at the 9:00 position.  A curvilinear capsulorrhexis was made with a cystotome and capsulorrhexis forceps.  Balanced salt solution was used to hydrodissect and hydrodelineate the nucleus.   Phacoemulsification was then used in stop and chop fashion to remove the lens nucleus and epinucleus.  The remaining cortex was then removed using the irrigation and aspiration handpiece. Provisc was then placed into the capsular bag to distend it for lens placement.  A lens was then injected into the capsular bag.  The remaining viscoelastic was aspirated.   Wounds were hydrated with balanced salt solution.  The anterior  chamber was inflated to a physiologic pressure with balanced salt solution.  No wound leaks were noted. Cefuroxime 0.1 ml of a 10mg /ml solution was injected into the anterior chamber for a dose of 1 mg of intracameral antibiotic at the completion of the case.   Timolol and Brimonidine drops were applied to the eye.  The patient was taken to the recovery room in stable condition without complications of anesthesia or surgery.   Brit Carbonell 02/11/2019, 11:44 AM

## 2019-02-11 NOTE — Anesthesia Postprocedure Evaluation (Signed)
Anesthesia Post Note  Patient: Stacy Hogan  Procedure(s) Performed: CATARACT EXTRACTION PHACO AND INTRAOCULAR LENS PLACEMENT ([place])  RIGHT PANOPTIX LENS (Right Eye)  Patient location during evaluation: PACU Anesthesia Type: MAC Level of consciousness: awake and alert, oriented and patient cooperative Pain management: pain level controlled Vital Signs Assessment: post-procedure vital signs reviewed and stable Respiratory status: spontaneous breathing, nonlabored ventilation and respiratory function stable Cardiovascular status: blood pressure returned to baseline and stable Postop Assessment: adequate PO intake Anesthetic complications: no    Darrin Nipper

## 2019-02-12 ENCOUNTER — Encounter: Payer: Self-pay | Admitting: Ophthalmology

## 2019-03-11 ENCOUNTER — Other Ambulatory Visit: Payer: Self-pay

## 2019-03-11 ENCOUNTER — Other Ambulatory Visit: Payer: Self-pay | Admitting: Family Medicine

## 2019-03-11 DIAGNOSIS — Z20822 Contact with and (suspected) exposure to covid-19: Secondary | ICD-10-CM

## 2019-03-12 LAB — NOVEL CORONAVIRUS, NAA: SARS-CoV-2, NAA: NOT DETECTED

## 2021-01-16 ENCOUNTER — Ambulatory Visit: Payer: 59 | Admitting: Anesthesiology

## 2021-01-16 ENCOUNTER — Encounter
Admission: RE | Disposition: A | Discharge status: Home / Self Care | Payer: Self-pay | Source: Home / Self Care | Attending: Gastroenterology

## 2021-01-16 ENCOUNTER — Ambulatory Visit
Admission: RE | Admit: 2021-01-16 | Discharge: 2021-01-16 | Disposition: A | Discharge status: Home / Self Care | Payer: 59 | Attending: Gastroenterology | Admitting: Gastroenterology

## 2021-01-16 ENCOUNTER — Other Ambulatory Visit: Payer: Self-pay

## 2021-01-16 DIAGNOSIS — D122 Benign neoplasm of ascending colon: Secondary | ICD-10-CM | POA: Diagnosis not present

## 2021-01-16 DIAGNOSIS — Z1211 Encounter for screening for malignant neoplasm of colon: Secondary | ICD-10-CM | POA: Diagnosis present

## 2021-01-16 DIAGNOSIS — D123 Benign neoplasm of transverse colon: Secondary | ICD-10-CM | POA: Insufficient documentation

## 2021-01-16 DIAGNOSIS — R1011 Right upper quadrant pain: Secondary | ICD-10-CM | POA: Insufficient documentation

## 2021-01-16 DIAGNOSIS — Z882 Allergy status to sulfonamides status: Secondary | ICD-10-CM | POA: Diagnosis not present

## 2021-01-16 DIAGNOSIS — K6389 Other specified diseases of intestine: Secondary | ICD-10-CM | POA: Insufficient documentation

## 2021-01-16 DIAGNOSIS — R140 Abdominal distension (gaseous): Secondary | ICD-10-CM | POA: Insufficient documentation

## 2021-01-16 DIAGNOSIS — G629 Polyneuropathy, unspecified: Secondary | ICD-10-CM | POA: Diagnosis not present

## 2021-01-16 DIAGNOSIS — Z853 Personal history of malignant neoplasm of breast: Secondary | ICD-10-CM | POA: Diagnosis not present

## 2021-01-16 DIAGNOSIS — Z89021 Acquired absence of right finger(s): Secondary | ICD-10-CM | POA: Diagnosis not present

## 2021-01-16 DIAGNOSIS — I719 Aortic aneurysm of unspecified site, without rupture: Secondary | ICD-10-CM | POA: Diagnosis not present

## 2021-01-16 DIAGNOSIS — Z85118 Personal history of other malignant neoplasm of bronchus and lung: Secondary | ICD-10-CM | POA: Diagnosis not present

## 2021-01-16 DIAGNOSIS — K2950 Unspecified chronic gastritis without bleeding: Secondary | ICD-10-CM | POA: Diagnosis not present

## 2021-01-16 DIAGNOSIS — Z9010 Acquired absence of unspecified breast and nipple: Secondary | ICD-10-CM | POA: Diagnosis not present

## 2021-01-16 HISTORY — PX: ESOPHAGOGASTRODUODENOSCOPY: SHX5428

## 2021-01-16 HISTORY — PX: COLONOSCOPY WITH PROPOFOL: SHX5780

## 2021-01-16 SURGERY — EGD (ESOPHAGOGASTRODUODENOSCOPY)
Anesthesia: General

## 2021-01-16 MED ORDER — PROPOFOL 500 MG/50ML IV EMUL
INTRAVENOUS | Status: DC | PRN
  Administered 2021-01-16: 50 ug/kg/min via INTRAVENOUS

## 2021-01-16 MED ORDER — SODIUM CHLORIDE 0.9 % IV SOLN
INTRAVENOUS | Status: DC
  Administered 2021-01-16: 20 mL/h via INTRAVENOUS

## 2021-01-16 MED ORDER — LIDOCAINE HCL (PF) 2 % IJ SOLN
INTRAMUSCULAR | Status: AC
  Filled 2021-01-16: qty 5

## 2021-01-16 MED ORDER — MIDAZOLAM HCL 2 MG/2ML IJ SOLN
INTRAMUSCULAR | Status: AC
  Filled 2021-01-16: qty 2

## 2021-01-16 MED ORDER — PROPOFOL 500 MG/50ML IV EMUL
INTRAVENOUS | Status: AC
  Filled 2021-01-16: qty 50

## 2021-01-16 MED ORDER — PROPOFOL 10 MG/ML IV BOLUS
INTRAVENOUS | Status: DC | PRN
  Administered 2021-01-16: 10 mg via INTRAVENOUS
  Administered 2021-01-16: 20 mg via INTRAVENOUS

## 2021-01-16 MED ORDER — FENTANYL CITRATE (PF) 100 MCG/2ML IJ SOLN
INTRAMUSCULAR | Status: AC
  Filled 2021-01-16: qty 2

## 2021-01-16 MED ORDER — MIDAZOLAM HCL 2 MG/2ML IJ SOLN
INTRAMUSCULAR | Status: DC | PRN
  Administered 2021-01-16 (×2): 1 mg via INTRAVENOUS

## 2021-01-16 MED ORDER — FENTANYL CITRATE (PF) 100 MCG/2ML IJ SOLN
INTRAMUSCULAR | Status: DC | PRN
  Administered 2021-01-16 (×4): 25 ug via INTRAVENOUS

## 2021-01-16 MED ORDER — LIDOCAINE HCL (CARDIAC) PF 100 MG/5ML IV SOSY
PREFILLED_SYRINGE | INTRAVENOUS | Status: DC | PRN
  Administered 2021-01-16: 50 mg via INTRAVENOUS

## 2021-01-16 NOTE — Anesthesia Preprocedure Evaluation (Signed)
Anesthesia Evaluation  Patient identified by MRN, date of birth, ID band Patient awake    Reviewed: Allergy & Precautions, NPO status , Patient's Chart, lab work & pertinent test results  History of Anesthesia Complications Negative for: history of anesthetic complications  Airway Mallampati: III   Neck ROM: full    Dental  (+) Chipped,    Pulmonary former smoker,  lung cancer;  stage IIB adenocarcinoma of the RUL with a separate tumor nodule in the same lobe, s/p right robot assisted VATS upper lobectomy on 05/13/2018.    Pulmonary exam normal breath sounds clear to auscultation       Cardiovascular + Peripheral Vascular Disease  Normal cardiovascular exam Rhythm:Regular Rate:Normal  AAA repaired 2005   Neuro/Psych Anxiety  Neuromuscular disease (neuropathy feet)    GI/Hepatic Bowel prep,  Endo/Other  negative endocrine ROS  Renal/GU . Chronic kidney disease (CKD), stage III (moderate)     Musculoskeletal  (+) Arthritis ,   Abdominal   Peds  Hematology lung ca  stage IIB adenocarcinoma of the RUL with a separate tumor nodule in the same lobe, s/p right robot assisted VATS upper lobectomy on 05/13/2018  L breast Ca > mastectomy > no iv/bp on L arm.   Anesthesia Other Findings . Intracranial arachnoid cyst  . Breast cancer, left breast (CMS-HCC)  . Peripheral vascular disease (CMS-HCC)  . S/P AAA repair, 2005  . Breast CA (CMS-HCC)  . Anxiety  . Hot flashes related to aromatase inhibitor therapy  . Aromatase inhibitor-associated arthralgia  . Irritable bowel  . PA (pernicious anemia)  . Neuropathy  . Family history of polyps in the colon  . At risk for osteoporosis/osteopenia  . Rotator cuff impingement syndrome of right shoulder  . Health care maintenance  . Chronic kidney disease (CKD), stage III (moderate) (CMS-HCC)  . Aortic atherosclerosis (CMS-HCC)  . Lymphedema of arm  . Arthritis of  carpometacarpal (CMC) joint of left thumb  . Arthritis of carpometacarpal (CMC) joint of left thumb  . Left cervical facet pain (Cervical spondylosis without myelopathy)  . Malignant neoplasm of right upper lobe of lung (CMS-HCC)  . Hyperlipidemia  . Depression  . Right upper lobe pulmonary nodule  . Chronic cough  . Myalgia due to statin  . Carpal tunnel syndrome of left wrist  . Arch pain of right foot  . Numbness of right foot  . Intervertebral disc disorder with radiculopathy of lumbar region  . Preoperative evaluation to rule out surgical contraindication     Reproductive/Obstetrics                             Anesthesia Physical  Anesthesia Plan  ASA: 3  Anesthesia Plan: General   Post-op Pain Management:    Induction: Intravenous  PONV Risk Score and Plan: 2 and TIVA and Propofol infusion  Airway Management Planned: Natural Airway and Nasal Cannula  Additional Equipment:   Intra-op Plan:   Post-operative Plan:   Informed Consent: I have reviewed the patients History and Physical, chart, labs and discussed the procedure including the risks, benefits and alternatives for the proposed anesthesia with the patient or authorized representative who has indicated his/her understanding and acceptance.       Plan Discussed with: CRNA, Anesthesiologist and Surgeon  Anesthesia Plan Comments:         Anesthesia Quick Evaluation

## 2021-01-16 NOTE — Anesthesia Postprocedure Evaluation (Signed)
Anesthesia Post Note  Patient: Stacy Hogan  Procedure(s) Performed: ESOPHAGOGASTRODUODENOSCOPY (EGD) COLONOSCOPY WITH PROPOFOL  Patient location during evaluation: Phase II Anesthesia Type: General Level of consciousness: awake and alert, awake and oriented Pain management: pain level controlled Vital Signs Assessment: post-procedure vital signs reviewed and stable Respiratory status: spontaneous breathing, nonlabored ventilation and respiratory function stable Cardiovascular status: blood pressure returned to baseline and stable Postop Assessment: no apparent nausea or vomiting Anesthetic complications: no   No notable events documented.   Last Vitals:  Vitals:   01/16/21 0919 01/16/21 0929  BP: (!) 165/99 (!) 154/92  Pulse: 87 88  Resp: 12 11  Temp:    SpO2: 99% 98%    Last Pain:  Vitals:   01/16/21 0929  TempSrc:   PainSc: 0-No pain                 Phill Mutter

## 2021-01-16 NOTE — H&P (Signed)
Outpatient short stay form Pre-procedure 01/16/2021 8:08 AM Raylene Miyamoto MD, MPH  Primary Physician: Dr. Ouida Sills  Reason for visit:  Epigastric pain and history of polyp  History of present illness:    75 y/o lady with history of lung cancer s/p resection and large polyp in 2015 here for surveillance colonoscopy. EGD for bloating/epigastric pain. No blood thinners. No family history of GI malignancies. History of hysterectomy, appendectomy, and c-sections.    Current Facility-Administered Medications:    0.9 %  sodium chloride infusion, , Intravenous, Continuous, Davian Wollenberg, Hilton Cork, MD, Last Rate: 20 mL/hr at 01/16/21 0733, 20 mL/hr at 01/16/21 0733  Medications Prior to Admission  Medication Sig Dispense Refill Last Dose   Cyanocobalamin (VITAMIN B12 PO) Take by mouth daily.   Past Week   MAGNESIUM PO Take by mouth daily.   Past Week   Multiple Vitamin (MULTIVITAMIN) tablet Take 1 tablet by mouth daily.   Past Week     Allergies  Allergen Reactions   Sulfa Antibiotics Other (See Comments)    Pt states she only took one pill and she was pink all over.      Past Medical History:  Diagnosis Date   Amputated finger    fingers, right hand, age 53 weeks. s/p blood clot in hand   Aortic aneurysm ([place])    repaired 2005   Arthritis    left thumb   Breast cancer, left ([place]) 2014   Mastectomy with lymph nodes resected, chemo + rad tx's   Lung cancer ([place]) 05/2018   right   Neuropathy of both feet    S/P chemo    Review of systems:  Otherwise negative.    Physical Exam  Gen: Alert, oriented. Appears stated age.  HEENT: PERRLA. Lungs: No respiratory distress CV: RRR Abd: soft, benign, no masses Ext: No edema    Planned procedures: Proceed with colonoscopy. The patient understands the nature of the planned procedure, indications, risks, alternatives and potential complications including but not limited to bleeding, infection, perforation, damage to internal organs  and possible oversedation/side effects from anesthesia. The patient agrees and gives consent to proceed.  Please refer to procedure notes for findings, recommendations and patient disposition/instructions.     Raylene Miyamoto MD, MPH Gastroenterology 01/16/2021  8:08 AM

## 2021-01-16 NOTE — Transfer of Care (Signed)
Immediate Anesthesia Transfer of Care Note  Patient: Stacy Hogan  Procedure(s) Performed: ESOPHAGOGASTRODUODENOSCOPY (EGD) COLONOSCOPY WITH PROPOFOL  Patient Location: PACU  Anesthesia Type:General  Level of Consciousness: sedated  Airway & Oxygen Therapy: Patient Spontanous Breathing and Patient connected to nasal cannula oxygen  Post-op Assessment: Report given to RN and Post -op Vital signs reviewed and stable  Post vital signs: Reviewed and stable  Last Vitals:  Vitals Value Taken Time  BP 95/52 01/16/21 0900  Temp 35.9 C 01/16/21 0859  Pulse 70 01/16/21 0900  Resp 12 01/16/21 0900  SpO2 99 % 01/16/21 0900  Vitals shown include unvalidated device data.  Last Pain:  Vitals:   01/16/21 0859  TempSrc: Tympanic  PainSc: 0-No pain         Complications: No notable events documented.

## 2021-01-16 NOTE — Interval H&P Note (Signed)
History and Physical Interval Note:  01/16/2021 8:11 AM  Stacy Hogan  has presented today for surgery, with the diagnosis of PRS HX.OF COLON POLYPS RT UPPER QUAD PAIN.  The various methods of treatment have been discussed with the patient and family. After consideration of risks, benefits and other options for treatment, the patient has consented to  Procedure(s): ESOPHAGOGASTRODUODENOSCOPY (EGD) (N/A) COLONOSCOPY WITH PROPOFOL (N/A) as a surgical intervention.  The patient's history has been reviewed, patient examined, no change in status, stable for surgery.  I have reviewed the patient's chart and labs.  Questions were answered to the patient's satisfaction.     Lesly Rubenstein  Ok to proceed with EGD/Colonoscopy

## 2021-01-16 NOTE — Op Note (Signed)
[facility] Gastroenterology Patient Name: Stacy Hogan Procedure Date: 01/16/2021 8:04 AM MRN: [id number] Account #: [id number] Date of Birth: Jan 30, 1946 Admit Type: Outpatient Age: 75 Room: [facility] ENDO ROOM 3 Gender: Female Note Status: Finalized Procedure:             Colonoscopy Indications:           High risk colon cancer surveillance: Personal history                         of adenoma (10 mm or greater in size) Providers:             Andrey Farmer MD, MD Referring MD:          Ocie Cornfield. Ouida Sills MD, MD (Referring MD) Medicines:             Monitored Anesthesia Care Complications:         No immediate complications. Estimated blood loss:                         Minimal. Procedure:             Pre-Anesthesia Assessment:                        - Prior to the procedure, a History and Physical was                         performed, and patient medications and allergies were                         reviewed. The patient is competent. The risks and                         benefits of the procedure and the sedation options and                         risks were discussed with the patient. All questions                         were answered and informed consent was obtained.                         Patient identification and proposed procedure were                         verified by the physician, the nurse, the anesthetist                         and the technician in the endoscopy suite. Mental                         Status Examination: alert and oriented. Airway                         Examination: normal oropharyngeal airway and neck                         mobility. Respiratory Examination: clear to  auscultation. CV Examination: normal. Prophylactic                         Antibiotics: The patient does not require prophylactic                         antibiotics. Prior Anticoagulants: The patient has                         taken no  previous anticoagulant or antiplatelet                         agents. ASA Grade Assessment: II - A patient with mild                         systemic disease. After reviewing the risks and                         benefits, the patient was deemed in satisfactory                         condition to undergo the procedure. The anesthesia                         plan was to use monitored anesthesia care (MAC).                         Immediately prior to administration of medications,                         the patient was re-assessed for adequacy to receive                         sedatives. The heart rate, respiratory rate, oxygen                         saturations, blood pressure, adequacy of pulmonary                         ventilation, and response to care were monitored                         throughout the procedure. The physical status of the                         patient was re-assessed after the procedure.                        After obtaining informed consent, the colonoscope was                         passed under direct vision. Throughout the procedure,                         the patient's blood pressure, pulse, and oxygen                         saturations were monitored continuously. The  Colonoscope was introduced through the anus and                         advanced to the the cecum, identified by appendiceal                         orifice and ileocecal valve. The colonoscopy was                         technically difficult and complex due to restricted                         mobility of the colon. Successful completion of the                         procedure was aided by using pediatric colonoscope.                         The patient tolerated the procedure well. The quality                         of the bowel preparation was adequate to identify                         polyps. Findings:      The perianal and digital rectal examinations  were normal.      A 7 mm polyp was found in the ascending colon. The polyp was sessile.       The polyp was removed with a cold snare. Resection and retrieval were       complete. Estimated blood loss was minimal.      A localized area of granular mucosa was found in the ascending colon       that was associated with ascending colon polyp. Biopsies were taken with       a cold forceps for histology. Estimated blood loss was minimal.      A tattoo was seen at the hepatic flexure. The tattoo site appeared       abnormal. There appeared to be a 1 cm residual polyp from polypectomy       done in 2015. The polyp was removed with a hot snare. Resection and       retrieval were complete. Estimated blood loss was minimal. To prevent       bleeding post-intervention, one hemostatic clip was successfully placed.       There was no bleeding during, or at the end, of the procedure.      The exam was otherwise without abnormality on direct and retroflexion       views. Impression:            - One 7 mm polyp in the ascending colon, removed with                         a cold snare. Resected and retrieved.                        - Granularity in the ascending colon. Biopsied.                        - A tattoo  was seen at the hepatic flexure. The tattoo                         site appeared abnormal as there was residual polyp.                         Polypectomy performed. Clip was placed.                        - The examination was otherwise normal on direct and                         retroflexion views. Recommendation:        - Discharge patient to home.                        - Resume previous diet.                        - Continue present medications.                        - Await pathology results.                        - Repeat colonoscopy for surveillance based on                         pathology results.                        - Return to referring physician as previously                          scheduled. Procedure Code(s):     --- Professional ---                        [phone], Colonoscopy, flexible; with removal of                         tumor(s), polyp(s), or other lesion(s) by snare                         technique                        [id number], 26, Colonoscopy, flexible; with biopsy, single                         or multiple Diagnosis Code(s):     --- Professional ---                        Z86.010, Personal history of colonic polyps                        K63.5, Polyp of colon                        K63.89, Other specified diseases of intestine CPT copyright 2019 [employer]. All rights reserved. The codes documented in this report are preliminary and upon coder review may  be revised to meet current compliance requirements. Andrey Farmer MD, MD 01/16/2021 9:06:30 AM Number of Addenda: 0 Note Initiated On: 01/16/2021 8:04 AM Scope Withdrawal Time: 0 hours 16 minutes 50 seconds  Total Procedure Duration: 0 hours 31 minutes 4 seconds  Estimated Blood Loss:  Estimated blood loss was minimal.      [facility]

## 2021-01-16 NOTE — Op Note (Signed)
[facility] Gastroenterology Patient Name: Stacy Hogan Procedure Date: 01/16/2021 8:06 AM MRN: [id number] Account #: [id number] Date of Birth: 05-Mar-1946 Admit Type: Outpatient Age: 75 Room: [facility] ENDO ROOM 3 Gender: Female Note Status: Finalized Procedure:             Upper GI endoscopy Indications:           Abdominal pain in the right upper quadrant, Abdominal                         bloating Providers:             Andrey Farmer MD, MD Referring MD:          Ocie Cornfield. Ouida Sills MD, MD (Referring MD) Medicines:             Monitored Anesthesia Care Complications:         No immediate complications. Estimated blood loss:                         Minimal. Procedure:             Pre-Anesthesia Assessment:                        - Prior to the procedure, a History and Physical was                         performed, and patient medications and allergies were                         reviewed. The patient is competent. The risks and                         benefits of the procedure and the sedation options and                         risks were discussed with the patient. All questions                         were answered and informed consent was obtained.                         Patient identification and proposed procedure were                         verified by the physician, the nurse, the anesthetist                         and the technician in the endoscopy suite. Mental                         Status Examination: alert and oriented. Airway                         Examination: normal oropharyngeal airway and neck                         mobility. Respiratory Examination: clear to  auscultation. CV Examination: normal. Prophylactic                         Antibiotics: The patient does not require prophylactic                         antibiotics. Prior Anticoagulants: The patient has                         taken no previous anticoagulant  or antiplatelet                         agents. ASA Grade Assessment: II - A patient with mild                         systemic disease. After reviewing the risks and                         benefits, the patient was deemed in satisfactory                         condition to undergo the procedure. The anesthesia                         plan was to use monitored anesthesia care (MAC).                         Immediately prior to administration of medications,                         the patient was re-assessed for adequacy to receive                         sedatives. The heart rate, respiratory rate, oxygen                         saturations, blood pressure, adequacy of pulmonary                         ventilation, and response to care were monitored                         throughout the procedure. The physical status of the                         patient was re-assessed after the procedure.                        After obtaining informed consent, the endoscope was                         passed under direct vision. Throughout the procedure,                         the patient's blood pressure, pulse, and oxygen                         saturations were monitored continuously. The Endoscope  was introduced through the mouth, and advanced to the                         second part of duodenum. The upper GI endoscopy was                         accomplished without difficulty. The patient tolerated                         the procedure well. Findings:      The examined esophagus was normal.      Patchy mild inflammation characterized by erythema was found in the       gastric fundus. Biopsies were taken with a cold forceps for Helicobacter       pylori testing. Estimated blood loss was minimal.      The exam of the stomach was otherwise normal.      The examined duodenum was normal. Impression:            - Normal esophagus.                        - Gastritis.  Biopsied.                        - Normal examined duodenum. Recommendation:        - Perform a colonoscopy today. Procedure Code(s):     --- Professional ---                        [id number], Esophagogastroduodenoscopy, flexible,                         transoral; with biopsy, single or multiple Diagnosis Code(s):     --- Professional ---                        K29.70, Gastritis, unspecified, without bleeding                        R10.11, Right upper quadrant pain                        R14.0, Abdominal distension (gaseous) CPT copyright 2019 American Medical Association. All rights reserved. The codes documented in this report are preliminary and upon coder review may  be revised to meet current compliance requirements. Andrey Farmer MD, MD 01/16/2021 8:59:46 AM Number of Addenda: 0 Note Initiated On: 01/16/2021 8:06 AM Estimated Blood Loss:  Estimated blood loss was minimal.      [facility]

## 2021-01-17 ENCOUNTER — Encounter: Payer: Self-pay | Admitting: Gastroenterology

## 2021-01-18 LAB — SURGICAL PATHOLOGY

## 2021-06-09 ENCOUNTER — Other Ambulatory Visit: Payer: Self-pay | Admitting: Otolaryngology

## 2021-06-09 DIAGNOSIS — R221 Localized swelling, mass and lump, neck: Secondary | ICD-10-CM

## 2021-06-23 ENCOUNTER — Other Ambulatory Visit: Payer: Self-pay

## 2021-06-23 ENCOUNTER — Ambulatory Visit
Admission: RE | Admit: 2021-06-23 | Discharge: 2021-06-23 | Disposition: A | Discharge status: Home / Self Care | Payer: 59 | Source: Ambulatory Visit | Attending: Otolaryngology | Admitting: Otolaryngology

## 2021-06-23 DIAGNOSIS — R221 Localized swelling, mass and lump, neck: Secondary | ICD-10-CM | POA: Insufficient documentation

## 2021-06-23 MED ORDER — IOHEXOL 300 MG/ML  SOLN
100.0000 mL | Freq: Once | INTRAMUSCULAR | Status: AC | PRN
  Administered 2021-06-23: 75 mL via INTRAVENOUS

## 2021-08-08 ENCOUNTER — Encounter: Payer: Self-pay | Admitting: Otolaryngology

## 2021-08-09 ENCOUNTER — Encounter: Payer: Self-pay | Admitting: Anesthesiology

## 2021-08-22 ENCOUNTER — Ambulatory Visit: Admission: RE | Admit: 2021-08-22 | Payer: 59 | Source: Home / Self Care | Admitting: Otolaryngology

## 2021-08-22 HISTORY — DX: Chronic kidney disease, stage 3 unspecified: N18.30

## 2021-08-22 SURGERY — EXCISION, MASS, NECK
Anesthesia: General | Laterality: Right

## 2022-01-23 ENCOUNTER — Encounter: Payer: Self-pay | Admitting: Otolaryngology

## 2022-01-30 ENCOUNTER — Other Ambulatory Visit: Payer: Self-pay

## 2022-01-30 ENCOUNTER — Ambulatory Visit
Admission: RE | Admit: 2022-01-30 | Discharge: 2022-01-30 | Disposition: A | Discharge status: Home / Self Care | Payer: 59 | Attending: Otolaryngology | Admitting: Otolaryngology

## 2022-01-30 ENCOUNTER — Encounter: Payer: Self-pay | Admitting: Otolaryngology

## 2022-01-30 ENCOUNTER — Ambulatory Visit: Payer: 59 | Admitting: Anesthesiology

## 2022-01-30 ENCOUNTER — Encounter
Admission: RE | Disposition: A | Discharge status: Home / Self Care | Payer: Self-pay | Source: Home / Self Care | Attending: Otolaryngology

## 2022-01-30 DIAGNOSIS — M1990 Unspecified osteoarthritis, unspecified site: Secondary | ICD-10-CM | POA: Insufficient documentation

## 2022-01-30 DIAGNOSIS — D170 Benign lipomatous neoplasm of skin and subcutaneous tissue of head, face and neck: Secondary | ICD-10-CM | POA: Insufficient documentation

## 2022-01-30 DIAGNOSIS — Z87891 Personal history of nicotine dependence: Secondary | ICD-10-CM | POA: Insufficient documentation

## 2022-01-30 DIAGNOSIS — Z853 Personal history of malignant neoplasm of breast: Secondary | ICD-10-CM | POA: Insufficient documentation

## 2022-01-30 DIAGNOSIS — Z902 Acquired absence of lung [part of]: Secondary | ICD-10-CM | POA: Diagnosis not present

## 2022-01-30 DIAGNOSIS — Z85118 Personal history of other malignant neoplasm of bronchus and lung: Secondary | ICD-10-CM | POA: Diagnosis not present

## 2022-01-30 DIAGNOSIS — R221 Localized swelling, mass and lump, neck: Secondary | ICD-10-CM | POA: Diagnosis present

## 2022-01-30 HISTORY — PX: EXCISION MASS NECK: SHX6703

## 2022-01-30 SURGERY — EXCISION, MASS, NECK
Anesthesia: General | Site: Neck | Laterality: Right

## 2022-01-30 MED ORDER — PROCHLORPERAZINE EDISYLATE 10 MG/2ML IJ SOLN: 5.0000 mg | INTRAMUSCULAR | Status: DC | PRN

## 2022-01-30 MED ORDER — OXYCODONE HCL 5 MG/5ML PO SOLN: 5.0000 mg | Freq: Once | ORAL | Status: DC | PRN

## 2022-01-30 MED ORDER — PROPOFOL 10 MG/ML IV BOLUS
INTRAVENOUS | Status: DC | PRN
  Administered 2022-01-30: 70 mg via INTRAVENOUS

## 2022-01-30 MED ORDER — DEXAMETHASONE SODIUM PHOSPHATE 4 MG/ML IJ SOLN
INTRAMUSCULAR | Status: DC | PRN
  Administered 2022-01-30: 4 mg via INTRAVENOUS

## 2022-01-30 MED ORDER — LIDOCAINE-EPINEPHRINE 1 %-1:100000 IJ SOLN
INTRAMUSCULAR | Status: DC | PRN
  Administered 2022-01-30: 3 mL via INTRADERMAL

## 2022-01-30 MED ORDER — MIDAZOLAM HCL 5 MG/5ML IJ SOLN
INTRAMUSCULAR | Status: DC | PRN
  Administered 2022-01-30: 1 mg via INTRAVENOUS

## 2022-01-30 MED ORDER — BACITRACIN 500 UNIT/GM EX OINT
TOPICAL_OINTMENT | CUTANEOUS | Status: DC | PRN
  Administered 2022-01-30: 1 via TOPICAL

## 2022-01-30 MED ORDER — GLYCOPYRROLATE 0.2 MG/ML IJ SOLN
INTRAMUSCULAR | Status: DC | PRN
  Administered 2022-01-30: .1 mg via INTRAVENOUS

## 2022-01-30 MED ORDER — LIDOCAINE HCL (CARDIAC) PF 100 MG/5ML IV SOSY
PREFILLED_SYRINGE | INTRAVENOUS | Status: DC | PRN
  Administered 2022-01-30: 40 mg via INTRATRACHEAL

## 2022-01-30 MED ORDER — LACTATED RINGERS IV SOLN: INTRAVENOUS | Status: DC

## 2022-01-30 MED ORDER — EPHEDRINE SULFATE (PRESSORS) 50 MG/ML IJ SOLN
INTRAMUSCULAR | Status: DC | PRN
  Administered 2022-01-30: 5 mg via INTRAVENOUS
  Administered 2022-01-30: 10 mg via INTRAVENOUS

## 2022-01-30 MED ORDER — HYDROMORPHONE HCL 1 MG/ML IJ SOLN: 0.2500 mg | INTRAMUSCULAR | Status: DC | PRN

## 2022-01-30 MED ORDER — OXYCODONE HCL 5 MG PO TABS: 5.0000 mg | ORAL_TABLET | Freq: Once | ORAL | Status: DC | PRN

## 2022-01-30 MED ORDER — ONDANSETRON HCL 4 MG/2ML IJ SOLN
INTRAMUSCULAR | Status: DC | PRN
  Administered 2022-01-30: 4 mg via INTRAVENOUS

## 2022-01-30 MED ORDER — FENTANYL CITRATE (PF) 100 MCG/2ML IJ SOLN
INTRAMUSCULAR | Status: DC | PRN
  Administered 2022-01-30: 25 ug via INTRAVENOUS

## 2022-01-30 SURGICAL SUPPLY — 23 items
ADH SKN CLS APL DERMABOND .7 (GAUZE/BANDAGES/DRESSINGS) ×1
APPLICATOR COTTON TIP WD 3 STR (MISCELLANEOUS) ×1 IMPLANT
CORD BIP STRL DISP 12FT (MISCELLANEOUS) ×1 IMPLANT
DERMABOND ADVANCED (GAUZE/BANDAGES/DRESSINGS) ×1
DERMABOND ADVANCED .7 DNX12 (GAUZE/BANDAGES/DRESSINGS) IMPLANT
ELECT REM PT RETURN 9FT ADLT (ELECTROSURGICAL) ×2
ELECTRODE REM PT RTRN 9FT ADLT (ELECTROSURGICAL) ×1 IMPLANT
GLOVE SURG ENC MOIS LTX SZ7.5 (GLOVE) ×4 IMPLANT
GOWN STRL REUS W/ TWL LRG LVL3 (GOWN DISPOSABLE) ×1 IMPLANT
GOWN STRL REUS W/TWL LRG LVL3 (GOWN DISPOSABLE) ×2
KIT TURNOVER KIT A (KITS) ×2 IMPLANT
NDL HYPO 25GX1X1/2 BEV (NEEDLE) ×1 IMPLANT
NEEDLE HYPO 25GX1X1/2 BEV (NEEDLE) ×2 IMPLANT
NS IRRIG 500ML POUR BTL (IV SOLUTION) ×2 IMPLANT
PACK ENT CUSTOM (PACKS) ×2 IMPLANT
PENCIL SMOKE EVACUATOR (MISCELLANEOUS) ×2 IMPLANT
SOL PREP PVP 2OZ (MISCELLANEOUS) ×2
SOLUTION PREP PVP 2OZ (MISCELLANEOUS) IMPLANT
STRAP BODY AND KNEE 60X3 (MISCELLANEOUS) ×2 IMPLANT
SUT PROLENE 5 0 P 3 (SUTURE) ×1 IMPLANT
SUT VIC AB 5-0 PC1 18 (SUTURE) ×1 IMPLANT
SYR 10ML LL (SYRINGE) ×2 IMPLANT
TOWEL OR 17X26 4PK STRL BLUE (TOWEL DISPOSABLE) ×2 IMPLANT

## 2022-01-30 NOTE — Anesthesia Preprocedure Evaluation (Signed)
Anesthesia Evaluation  Patient identified by MRN, date of birth, ID band Patient awake    Reviewed: Allergy & Precautions, H&P , NPO status , Patient's Chart, lab work & pertinent test results, reviewed documented beta blocker date and time   Airway Mallampati: II  TM Distance: >3 FB Neck ROM: full    Dental no notable dental hx.    Pulmonary neg pulmonary ROS, former smoker,  H/o lung cancer, s/p R sided lobectomy   Pulmonary exam normal breath sounds clear to auscultation       Cardiovascular Exercise Tolerance: Good negative cardio ROS   Rhythm:regular Rate:Normal     Neuro/Psych negative neurological ROS  negative psych ROS   GI/Hepatic negative GI ROS, Neg liver ROS,   Endo/Other  negative endocrine ROS  Renal/GU CRFRenal diseasenegative Renal ROS  negative genitourinary   Musculoskeletal  (+) Arthritis ,   Abdominal   Peds  Hematology negative hematology ROS (+) Breast cancer L s/p mastectomy with resected LN   Anesthesia Other Findings   Reproductive/Obstetrics negative OB ROS                             Anesthesia Physical Anesthesia Plan  ASA: 2  Anesthesia Plan: General   Post-op Pain Management:    Induction:   PONV Risk Score and Plan: 3 and Dexamethasone, Ondansetron and Treatment may vary due to age or medical condition  Airway Management Planned:   Additional Equipment:   Intra-op Plan:   Post-operative Plan:   Informed Consent: I have reviewed the patients History and Physical, chart, labs and discussed the procedure including the risks, benefits and alternatives for the proposed anesthesia with the patient or authorized representative who has indicated his/her understanding and acceptance.     Dental Advisory Given  Plan Discussed with: CRNA  Anesthesia Plan Comments:         Anesthesia Quick Evaluation

## 2022-01-30 NOTE — Transfer of Care (Signed)
Immediate Anesthesia Transfer of Care Note  Patient: Stacy Hogan  Procedure(s) Performed: EXCISION MASS NECK (Right: Neck)  Patient Location: PACU  Anesthesia Type: General  Level of Consciousness: awake, alert  and patient cooperative  Airway and Oxygen Therapy: Patient Spontanous Breathing and Patient connected to supplemental oxygen  Post-op Assessment: Post-op Vital signs reviewed, Patient's Cardiovascular Status Stable, Respiratory Function Stable, Patent Airway and No signs of Nausea or vomiting  Post-op Vital Signs: Reviewed and stable  Complications: No notable events documented.

## 2022-01-30 NOTE — Op Note (Signed)
01/30/2022  12:40 PM    Stacy Hogan  [id number]   Pre-Op Diagnosis:  Right Neck Mass   Post-op Diagnosis: Same  Procedure:   Excision of right subcutaneous neck mass  with intermediate closure (4 cm)  Surgeon:  Sandi Mealy  Anesthesia:  General endotracheal  EBL:  minimal  Complications:  None  Findings: 4.5 x 3.5 cm subcutaneous mass consistent with lipoma  Procedure: After the patient was identified in holding and the procedure was reviewed.  The patient was taken to the operating room and with the patient in a comfortable supine position,  general anesthesia was induced without difficulty using a .  A proper time-out was performed.  The skin around the mass was infiltrated with 1% lidocaine with epiniephrine, 1:100,000. The area was then prepped and draped in the usual sterile fashion.   The skin was then incised in an ellipse at the inferior margin of the mass, along a skin crease with a 15 blade. The incision was carried down to the subcutanenous adipose and  the mass was then carefully dissected with tenotomies subcutaneously, dividing soft tissue attachments to the fatty tumor, carefully dissecting circumferentially around the mass, which was superficial to the platysma. Bipolar cautery was used as needed to control minor bleeding. The dead space from the mass was closed with 5-0 Vicryl suture to reduce risk of hematoma formation. The subcutaneous tissues were closed with 5-0 Vicryl, and the skin closed with 5-0 Prolene. Bacitracin ointment was applied, followed by a  dressing. The patient was then returned to the anesthesiologist in good condition. The patient was then extubated and taken to the recovery room in good condition.   Disposition:   PACU to home  Plan: Dressing to be removed tomorrow, followed by applying antibiotic ointment to the wound twice daily. Tylenol may be used as needed for pain. Ice to area PRN for swelling. Follow-up in 1 week.   Sandi Mealy 01/30/2022 12:40 PM

## 2022-01-31 ENCOUNTER — Encounter: Payer: Self-pay | Admitting: Otolaryngology

## 2022-02-01 LAB — SURGICAL PATHOLOGY

## 2022-04-13 ENCOUNTER — Other Ambulatory Visit: Payer: Self-pay | Admitting: Family Medicine

## 2022-04-13 DIAGNOSIS — M5416 Radiculopathy, lumbar region: Secondary | ICD-10-CM

## 2022-04-27 ENCOUNTER — Ambulatory Visit
Admission: RE | Admit: 2022-04-27 | Discharge: 2022-04-27 | Disposition: A | Discharge status: Home / Self Care | Payer: 59 | Source: Ambulatory Visit | Attending: Family Medicine | Admitting: Family Medicine

## 2022-04-27 DIAGNOSIS — M5416 Radiculopathy, lumbar region: Secondary | ICD-10-CM

## 2022-07-10 ENCOUNTER — Other Ambulatory Visit: Payer: Self-pay | Admitting: Gastroenterology

## 2022-07-10 DIAGNOSIS — R1902 Left upper quadrant abdominal swelling, mass and lump: Secondary | ICD-10-CM

## 2022-07-10 DIAGNOSIS — Z853 Personal history of malignant neoplasm of breast: Secondary | ICD-10-CM

## 2022-07-10 DIAGNOSIS — Z85118 Personal history of other malignant neoplasm of bronchus and lung: Secondary | ICD-10-CM

## 2022-07-10 DIAGNOSIS — R140 Abdominal distension (gaseous): Secondary | ICD-10-CM

## 2022-07-11 ENCOUNTER — Other Ambulatory Visit (HOSPITAL_COMMUNITY): Payer: Self-pay | Admitting: Gastroenterology

## 2022-07-11 DIAGNOSIS — R1013 Epigastric pain: Secondary | ICD-10-CM

## 2022-07-11 DIAGNOSIS — Z86010 Personal history of colonic polyps: Secondary | ICD-10-CM

## 2022-07-18 ENCOUNTER — Ambulatory Visit
Admission: RE | Admit: 2022-07-18 | Discharge: 2022-07-18 | Disposition: A | Discharge status: Home / Self Care | Payer: 59 | Source: Ambulatory Visit | Attending: Gastroenterology | Admitting: Gastroenterology

## 2022-07-18 DIAGNOSIS — R1902 Left upper quadrant abdominal swelling, mass and lump: Secondary | ICD-10-CM | POA: Diagnosis not present

## 2022-07-18 DIAGNOSIS — R140 Abdominal distension (gaseous): Secondary | ICD-10-CM | POA: Insufficient documentation

## 2022-07-18 DIAGNOSIS — Z853 Personal history of malignant neoplasm of breast: Secondary | ICD-10-CM | POA: Diagnosis present

## 2022-07-18 DIAGNOSIS — Z85118 Personal history of other malignant neoplasm of bronchus and lung: Secondary | ICD-10-CM | POA: Insufficient documentation

## 2022-07-18 MED ORDER — IOHEXOL 300 MG/ML  SOLN
100.0000 mL | Freq: Once | INTRAMUSCULAR | Status: AC | PRN
  Administered 2022-07-18: 100 mL via INTRAVENOUS

## 2022-07-20 ENCOUNTER — Ambulatory Visit
Admission: RE | Admit: 2022-07-20 | Discharge: 2022-07-20 | Disposition: A | Discharge status: Home / Self Care | Payer: 59 | Source: Ambulatory Visit | Attending: Gastroenterology | Admitting: Gastroenterology

## 2022-07-20 ENCOUNTER — Other Ambulatory Visit (HOSPITAL_COMMUNITY): Payer: Self-pay | Admitting: Gastroenterology

## 2022-07-20 DIAGNOSIS — R1013 Epigastric pain: Secondary | ICD-10-CM | POA: Diagnosis present

## 2022-07-20 DIAGNOSIS — Z86010 Personal history of colonic polyps: Secondary | ICD-10-CM | POA: Insufficient documentation

## 2022-07-20 DIAGNOSIS — K6289 Other specified diseases of anus and rectum: Secondary | ICD-10-CM

## 2022-07-20 MED ORDER — TECHNETIUM TC 99M SULFUR COLLOID
2.1000 | Freq: Once | INTRAVENOUS | Status: AC | PRN
Start: 2022-07-20 — End: 2022-07-20
  Administered 2022-07-20: 2.1 via ORAL

## 2022-08-07 ENCOUNTER — Encounter: Payer: Self-pay | Admitting: Internal Medicine

## 2022-08-08 ENCOUNTER — Encounter
Admission: RE | Disposition: A | Discharge status: Home / Self Care | Payer: Self-pay | Source: Home / Self Care | Attending: Internal Medicine

## 2022-08-08 ENCOUNTER — Ambulatory Visit: Payer: 59 | Admitting: Anesthesiology

## 2022-08-08 ENCOUNTER — Ambulatory Visit
Admission: RE | Admit: 2022-08-08 | Discharge: 2022-08-08 | Disposition: A | Discharge status: Home / Self Care | Payer: 59 | Attending: Internal Medicine | Admitting: Internal Medicine

## 2022-08-08 DIAGNOSIS — Z9012 Acquired absence of left breast and nipple: Secondary | ICD-10-CM | POA: Diagnosis not present

## 2022-08-08 DIAGNOSIS — Z89021 Acquired absence of right finger(s): Secondary | ICD-10-CM | POA: Insufficient documentation

## 2022-08-08 DIAGNOSIS — Z853 Personal history of malignant neoplasm of breast: Secondary | ICD-10-CM | POA: Insufficient documentation

## 2022-08-08 DIAGNOSIS — Z1211 Encounter for screening for malignant neoplasm of colon: Secondary | ICD-10-CM | POA: Insufficient documentation

## 2022-08-08 DIAGNOSIS — J449 Chronic obstructive pulmonary disease, unspecified: Secondary | ICD-10-CM | POA: Diagnosis not present

## 2022-08-08 DIAGNOSIS — Z87891 Personal history of nicotine dependence: Secondary | ICD-10-CM | POA: Insufficient documentation

## 2022-08-08 DIAGNOSIS — Z86010 Personal history of colonic polyps: Secondary | ICD-10-CM | POA: Diagnosis not present

## 2022-08-08 DIAGNOSIS — N183 Chronic kidney disease, stage 3 unspecified: Secondary | ICD-10-CM | POA: Insufficient documentation

## 2022-08-08 DIAGNOSIS — Z923 Personal history of irradiation: Secondary | ICD-10-CM | POA: Diagnosis not present

## 2022-08-08 DIAGNOSIS — Z9221 Personal history of antineoplastic chemotherapy: Secondary | ICD-10-CM | POA: Insufficient documentation

## 2022-08-08 DIAGNOSIS — Z85118 Personal history of other malignant neoplasm of bronchus and lung: Secondary | ICD-10-CM | POA: Diagnosis not present

## 2022-08-08 HISTORY — PX: COLONOSCOPY WITH PROPOFOL: SHX5780

## 2022-08-08 SURGERY — COLONOSCOPY WITH PROPOFOL
Anesthesia: General

## 2022-08-08 MED ORDER — PROPOFOL 10 MG/ML IV BOLUS
INTRAVENOUS | Status: DC | PRN
  Administered 2022-08-08: 90 mg via INTRAVENOUS
  Administered 2022-08-08 (×2): 40 mg via INTRAVENOUS

## 2022-08-08 MED ORDER — SODIUM CHLORIDE 0.9 % IV SOLN
INTRAVENOUS | Status: DC
  Administered 2022-08-08: 20 mL/h via INTRAVENOUS

## 2022-08-08 MED ORDER — LIDOCAINE HCL (CARDIAC) PF 100 MG/5ML IV SOSY
PREFILLED_SYRINGE | INTRAVENOUS | Status: DC | PRN
  Administered 2022-08-08: 50 mg via INTRAVENOUS

## 2022-08-08 NOTE — Op Note (Signed)
[facility] Gastroenterology Patient Name: Stacy Hogan Procedure Date: 08/08/2022 11:07 AM MRN: [id number] Account #: [id number] Date of Birth: [birth year] Admit Type: Outpatient Age: 77 Room: [facility] ENDO ROOM 2 Gender: Female Note Status: Finalized Instrument Name: Jasper Riling [id number] Procedure:             Colonoscopy Indications:           High risk colon cancer surveillance: Personal history                         of non-advanced adenoma Providers:             Benay Pike. Alice Reichert MD, MD Referring MD:          Benay Pike. Alice Reichert MD, MD (Referring MD), Ocie Cornfield.                         Ouida Sills MD, MD (Referring MD) Medicines:             Propofol per Anesthesia Complications:         No immediate complications. Procedure:             Pre-Anesthesia Assessment:                        - The risks and benefits of the procedure and the                         sedation options and risks were discussed with the                         patient. All questions were answered and informed                         consent was obtained.                        - Patient identification and proposed procedure were                         verified prior to the procedure by the nurse. The                         procedure was verified in the procedure room.                        - ASA Grade Assessment: III - A patient with severe                         systemic disease.                        - After reviewing the risks and benefits, the patient                         was deemed in satisfactory condition to undergo the                         procedure.                        After  obtaining informed consent, the colonoscope was                         passed under direct vision. Throughout the procedure,                         the patient's blood pressure, pulse, and oxygen                         saturations were monitored continuously. The                         Colonoscope  was introduced through the anus and                         advanced to the the cecum, identified by appendiceal                         orifice and ileocecal valve. The colonoscopy was                         somewhat difficult due to restricted mobility of the                         colon. Successful completion of the procedure was                         aided by applying abdominal pressure. The patient                         tolerated the procedure well. The quality of the bowel                         preparation was good. The ileocecal valve, appendiceal                         orifice, and rectum were photographed. Findings:      The perianal and digital rectal examinations were normal. Pertinent       negatives include normal sphincter tone and no palpable rectal lesions.      A tattoo was seen at the hepatic flexure. A post-polypectomy scar was       found at the tattoo site. There was no evidence of residual polyp tissue.      The exam was otherwise without abnormality on direct and retroflexion       views. Impression:            - A tattoo was seen at the hepatic flexure. A                         post-polypectomy scar was found at the tattoo site.                         There was no evidence of residual polyp tissue.                        - The examination was otherwise normal on direct and  retroflexion views.                        - No specimens collected. Recommendation:        - Patient has a contact number available for                         emergencies. The signs and symptoms of potential                         delayed complications were discussed with the patient.                         Return to normal activities tomorrow. Written                         discharge instructions were provided to the patient.                        - Resume previous diet.                        - Continue present medications.                        - No  repeat colonoscopy due to current age (31 years                         or older) and the absence of advanced adenomas.                        - Return to GI office PRN.                        - The findings and recommendations were discussed with                         the patient. Procedure Code(s):     --- Professional ---                        U8372, Colorectal cancer screening; colonoscopy on                         individual at high risk Diagnosis Code(s):     --- Professional ---                        Z86.010, Personal history of colonic polyps CPT copyright 2022 [employer]. All rights reserved. The codes documented in this report are preliminary and upon coder review may  be revised to meet current compliance requirements. Efrain Sella MD, MD 08/08/2022 11:38:00 AM This report has been signed electronically. Number of Addenda: 0 Note Initiated On: 08/08/2022 11:07 AM Scope Withdrawal Time: 0 hours 4 minutes 15 seconds  Total Procedure Duration: 0 hours 13 minutes 42 seconds  Estimated Blood Loss:  Estimated blood loss: none.      [facility]

## 2022-08-08 NOTE — Interval H&P Note (Signed)
History and Physical Interval Note:  08/08/2022 11:09 AM  Sharen Heck  has presented today for surgery, with the diagnosis of HX OF ADENOMATOUS COLONIC POLYPS.  The various methods of treatment have been discussed with the patient and family. After consideration of risks, benefits and other options for treatment, the patient has consented to  Procedure(s): COLONOSCOPY WITH PROPOFOL (N/A) as a surgical intervention.  The patient's history has been reviewed, patient examined, no change in status, stable for surgery.  I have reviewed the patient's chart and labs.  Questions were answered to the patient's satisfaction.     Thornton, Lyman

## 2022-08-08 NOTE — Anesthesia Postprocedure Evaluation (Signed)
Anesthesia Post Note  Patient: Stacy Hogan  Procedure(s) Performed: COLONOSCOPY WITH PROPOFOL  Patient location during evaluation: PACU Anesthesia Type: General Level of consciousness: awake Pain management: satisfactory to patient Vital Signs Assessment: post-procedure vital signs reviewed and stable Respiratory status: nonlabored ventilation and respiratory function stable Cardiovascular status: stable Anesthetic complications: no  No notable events documented.   Last Vitals:  Vitals:   08/08/22 1140 08/08/22 1143  BP:    Pulse: 89 93  Resp: (!) 21 20  Temp:    SpO2: 99% 98%    Last Pain:  Vitals:   08/08/22 1017  TempSrc: Temporal  PainSc: 0-No pain                 VAN STAVEREN,Lizann Edelman

## 2022-08-08 NOTE — Transfer of Care (Signed)
Immediate Anesthesia Transfer of Care Note  Patient: Stacy Hogan  Procedure(s) Performed: COLONOSCOPY WITH PROPOFOL  Patient Location: Endoscopy Unit  Anesthesia Type:General  Level of Consciousness: drowsy  Airway & Oxygen Therapy: Patient Spontanous Breathing and Patient connected to nasal cannula oxygen  Post-op Assessment: Report given to RN, Post -op Vital signs reviewed and stable, and Patient moving all extremities  Post vital signs: Reviewed and stable  Last Vitals:  Vitals Value Taken Time  BP 141/75 08/08/22 1138  Temp    Pulse 91 08/08/22 1138  Resp 23 08/08/22 1138  SpO2 100 % 08/08/22 1138    Last Pain:  Vitals:   08/08/22 1017  TempSrc: Temporal  PainSc: 0-No pain         Complications: No notable events documented.

## 2022-08-08 NOTE — H&P (Signed)
Outpatient short stay form Pre-procedure 08/08/2022 11:07 AM Stacy Hogan K. Alice Reichert, M.D.  Primary Physician: Frazier Richards, M.D.  Reason for visit:  Personal history of adenomatous colon polyps  History of present illness:  Personal history of adenomatous polyps. Schedule diagnostic colonoscopy with monitored anesthesia due to severe systemic illness. Procedure information given: indications, benefits, risks- including, but not limited to bleeding, infection, perforation, difficulty with sedation, were discussed with the patient, and they are agreeable to the procedure   Patient denies change in bowel habits, rectal bleeding, weight loss or abdominal pain.    Current Facility-Administered Medications:    0.9 %  sodium chloride infusion, , Intravenous, Continuous, [place], Benay Pike, MD, Last Rate: 20 mL/hr at 08/08/22 1056, Continued from Pre-op at 08/08/22 1056  Medications Prior to Admission  Medication Sig Dispense Refill Last Dose   MAGNESIUM PO Take by mouth daily.   Past Week   Multiple Vitamin (MULTIVITAMIN) tablet Take 1 tablet by mouth daily.   08/07/2022     Allergies  Allergen Reactions   Lexapro [Escitalopram] Nausea Only   Macrobid [Nitrofurantoin] Nausea Only   Sulfa Antibiotics Other (See Comments)    Pt states she only took one pill and she was pink all over.      Past Medical History:  Diagnosis Date   Amputated finger    fingers, right hand, age 63 weeks. s/p blood clot in hand   Aortic aneurysm ([place])    repaired 2005   Arthritis    left thumb   Breast cancer, left ([place]) 2014   Mastectomy with lymph nodes resected, chemo + rad tx's   CKD (chronic kidney disease), stage III ([place])    Lung cancer ([place]) 05/2018   right   Neuropathy of both feet    S/P chemo    Review of systems:  Otherwise negative.    Physical Exam  Gen: Alert, oriented. Appears stated age.  HEENT: [place]/AT. PERRLA. Lungs: CTA, no wheezes. CV: RR nl S1, S2. Abd: soft, benign, no masses.  BS+ Ext: No edema. Pulses 2+    Planned procedures: Proceed with colonoscopy. The patient understands the nature of the planned procedure, indications, risks, alternatives and potential complications including but not limited to bleeding, infection, perforation, damage to internal organs and possible oversedation/side effects from anesthesia. The patient agrees and gives consent to proceed.  Please refer to procedure notes for findings, recommendations and patient disposition/instructions.     Stacy Hogan K. Alice Reichert, M.D. Gastroenterology 08/08/2022  11:07 AM

## 2022-08-08 NOTE — Anesthesia Preprocedure Evaluation (Signed)
Anesthesia Evaluation  Patient identified by MRN, date of birth, ID band Patient awake    Reviewed: Allergy & Precautions, NPO status , Patient's Chart, lab work & pertinent test results  Airway Mallampati: II  TM Distance: >3 FB Neck ROM: full    Dental  (+) Teeth Intact   Pulmonary neg pulmonary ROS, COPD, former smoker Hx of R sided lung ca   Pulmonary exam normal  + decreased breath sounds      Cardiovascular Exercise Tolerance: Good + DOE  negative cardio ROS Normal cardiovascular exam Rhythm:Regular     Neuro/Psych negative neurological ROS  negative psych ROS   GI/Hepatic negative GI ROS, Neg liver ROS,,,  Endo/Other  negative endocrine ROS    Renal/GU CRFRenal diseasenegative Renal ROS  negative genitourinary   Musculoskeletal   Abdominal Normal abdominal exam  (+)   Peds negative pediatric ROS (+)  Hematology negative hematology ROS (+)   Anesthesia Other Findings Past Medical History: No date: Amputated finger     Comment:  fingers, right hand, age 5 weeks. s/p blood clot in hand No date: Aortic aneurysm ([place])     Comment:  repaired 2005 No date: Arthritis     Comment:  left thumb 2014: Breast cancer, left ([place])     Comment:  Mastectomy with lymph nodes resected, chemo + rad tx's No date: CKD (chronic kidney disease), stage III ([place]) 05/2018: Lung cancer ([place])     Comment:  right No date: Neuropathy of both feet     Comment:  S/P chemo  Past Surgical History: 2005: ABDOMINAL AORTIC ANEURYSM REPAIR 1974: ABDOMINAL HYSTERECTOMY No date: APPENDECTOMY No date: BACK SURGERY     Comment:  cervical x2. C5-6, C6-7 01/21/2019: CATARACT EXTRACTION W/PHACO; Left     Comment:  Procedure: CATARACT EXTRACTION PHACO AND INTRAOCULAR               LENS PLACEMENT ([place]) V LEFT;  Surgeon: Leandrew Koyanagi, MD;  Location: [facility];  Service:               Ophthalmology;  Laterality:  Left; 02/11/2019: CATARACT EXTRACTION W/PHACO; Right     Comment:  Procedure: CATARACT EXTRACTION PHACO AND INTRAOCULAR               LENS PLACEMENT ([place])  RIGHT PANOPTIX LENS;  Surgeon:               Leandrew Koyanagi, MD;  Location: [facility];              Service: Ophthalmology;  Laterality: Right; No date: CESAREAN SECTION 01/16/2021: COLONOSCOPY WITH PROPOFOL; N/A     Comment:  Procedure: COLONOSCOPY WITH PROPOFOL;  Surgeon:               Lesly Rubenstein, MD;  Location: [facility] ENDOSCOPY;                Service: Endoscopy;  Laterality: N/A; 01/16/2021: ESOPHAGOGASTRODUODENOSCOPY; N/A     Comment:  Procedure: ESOPHAGOGASTRODUODENOSCOPY (EGD);  Surgeon:               Lesly Rubenstein, MD;  Location: [facility] ENDOSCOPY;                Service: Endoscopy;  Laterality: N/A; 01/30/2022: EXCISION MASS NECK; Right     Comment:  Procedure: EXCISION MASS NECK;  Surgeon: Clyde Canterbury,  MD;  Location: [facility];  Service: ENT;                Laterality: Right; 05/2018: LUNG LOBECTOMY; Right     Comment:  partial 2014: MASTECTOMY; Left 2006: OVARIAN CYST SURGERY  BMI    Body Mass Index: 23.03 kg/m      Reproductive/Obstetrics negative OB ROS                             Anesthesia Physical Anesthesia Plan  ASA: 3  Anesthesia Plan: General   Post-op Pain Management:    Induction: Intravenous  PONV Risk Score and Plan: Propofol infusion and TIVA  Airway Management Planned: Natural Airway  Additional Equipment:   Intra-op Plan:   Post-operative Plan:   Informed Consent: I have reviewed the patients History and Physical, chart, labs and discussed the procedure including the risks, benefits and alternatives for the proposed anesthesia with the patient or authorized representative who has indicated his/her understanding and acceptance.     Dental Advisory Given  Plan Discussed with: CRNA and Surgeon  Anesthesia Plan  Comments:        Anesthesia Quick Evaluation

## 2022-08-09 ENCOUNTER — Encounter: Payer: Self-pay | Admitting: Internal Medicine

## 2022-10-17 ENCOUNTER — Ambulatory Visit: Payer: 59
